# Patient Record
Sex: Male | Born: 1955
Health system: Southern US, Community
[De-identification: ages and names within clinical notes are randomized; demographics above are authoritative.]

## PROBLEM LIST (undated history)

## (undated) DIAGNOSIS — I219 Acute myocardial infarction, unspecified: Secondary | ICD-10-CM

## (undated) DIAGNOSIS — I2510 Atherosclerotic heart disease of native coronary artery without angina pectoris: Secondary | ICD-10-CM

## (undated) DIAGNOSIS — N529 Male erectile dysfunction, unspecified: Secondary | ICD-10-CM

## (undated) DIAGNOSIS — R7303 Prediabetes: Secondary | ICD-10-CM

## (undated) DIAGNOSIS — T466X5A Adverse effect of antihyperlipidemic and antiarteriosclerotic drugs, initial encounter: Secondary | ICD-10-CM

## (undated) DIAGNOSIS — I10 Essential (primary) hypertension: Secondary | ICD-10-CM

## (undated) DIAGNOSIS — K219 Gastro-esophageal reflux disease without esophagitis: Secondary | ICD-10-CM

## (undated) DIAGNOSIS — M791 Myalgia, unspecified site: Secondary | ICD-10-CM

## (undated) DIAGNOSIS — F419 Anxiety disorder, unspecified: Secondary | ICD-10-CM

## (undated) DIAGNOSIS — E039 Hypothyroidism, unspecified: Secondary | ICD-10-CM

## (undated) HISTORY — PX: CORONARY ANGIOPLASTY: SHX604

## (undated) HISTORY — DX: Acute myocardial infarction, unspecified: I21.9

## (undated) HISTORY — DX: Atherosclerotic heart disease of native coronary artery without angina pectoris: I25.10

## (undated) HISTORY — DX: Myalgia, unspecified site: M79.10

## (undated) HISTORY — DX: Male erectile dysfunction, unspecified: N52.9

## (undated) HISTORY — PX: KNEE ARTHROSCOPY: SUR90

## (undated) HISTORY — PX: CHOLECYSTECTOMY: SHX55

## (undated) HISTORY — DX: Essential (primary) hypertension: I10

## (undated) HISTORY — PX: APPENDECTOMY: SHX54

## (undated) HISTORY — DX: Adverse effect of antihyperlipidemic and antiarteriosclerotic drugs, initial encounter: T46.6X5A

---

## 1998-01-15 ENCOUNTER — Emergency Department (HOSPITAL_COMMUNITY): Admission: EM | Admit: 1998-01-15 | Discharge: 1998-01-15 | Payer: Self-pay | Admitting: Emergency Medicine

## 1998-05-02 ENCOUNTER — Encounter: Payer: Self-pay | Admitting: Family Medicine

## 1998-05-02 ENCOUNTER — Ambulatory Visit (HOSPITAL_COMMUNITY): Admission: RE | Admit: 1998-05-02 | Discharge: 1998-05-02 | Payer: Self-pay | Admitting: Family Medicine

## 1998-05-08 ENCOUNTER — Ambulatory Visit (HOSPITAL_COMMUNITY): Admission: RE | Admit: 1998-05-08 | Discharge: 1998-05-09 | Payer: Self-pay | Admitting: General Surgery

## 1998-05-08 ENCOUNTER — Encounter: Payer: Self-pay | Admitting: General Surgery

## 1999-01-03 ENCOUNTER — Encounter: Admission: RE | Admit: 1999-01-03 | Discharge: 1999-01-03 | Payer: Self-pay | Admitting: Pediatrics

## 1999-01-03 ENCOUNTER — Encounter: Payer: Self-pay | Admitting: Pediatrics

## 2002-07-27 ENCOUNTER — Ambulatory Visit (HOSPITAL_COMMUNITY): Admission: RE | Admit: 2002-07-27 | Discharge: 2002-07-27 | Payer: Self-pay | Admitting: Gastroenterology

## 2002-07-27 ENCOUNTER — Encounter: Payer: Self-pay | Admitting: Gastroenterology

## 2002-08-07 ENCOUNTER — Emergency Department (HOSPITAL_COMMUNITY): Admission: EM | Admit: 2002-08-07 | Discharge: 2002-08-07 | Payer: Self-pay | Admitting: Emergency Medicine

## 2015-10-02 ENCOUNTER — Encounter (HOSPITAL_COMMUNITY)
Admission: EM | Disposition: A | Discharge status: Home / Self Care | Payer: Self-pay | Source: Home / Self Care | Attending: Cardiology

## 2015-10-02 ENCOUNTER — Encounter (HOSPITAL_BASED_OUTPATIENT_CLINIC_OR_DEPARTMENT_OTHER): Payer: Self-pay | Admitting: *Deleted

## 2015-10-02 ENCOUNTER — Other Ambulatory Visit: Payer: Self-pay

## 2015-10-02 ENCOUNTER — Emergency Department (HOSPITAL_BASED_OUTPATIENT_CLINIC_OR_DEPARTMENT_OTHER): Payer: 59

## 2015-10-02 ENCOUNTER — Inpatient Hospital Stay (HOSPITAL_BASED_OUTPATIENT_CLINIC_OR_DEPARTMENT_OTHER)
Admission: EM | Admit: 2015-10-02 | Discharge: 2015-10-04 | DRG: 247 | Disposition: A | Discharge status: Home / Self Care | Payer: 59 | Attending: Cardiology | Admitting: Cardiology

## 2015-10-02 ENCOUNTER — Emergency Department (HOSPITAL_COMMUNITY): Admission: RE | Admit: 2015-10-02 | Payer: Self-pay | Source: Ambulatory Visit

## 2015-10-02 DIAGNOSIS — I255 Ischemic cardiomyopathy: Secondary | ICD-10-CM | POA: Diagnosis present

## 2015-10-02 DIAGNOSIS — K219 Gastro-esophageal reflux disease without esophagitis: Secondary | ICD-10-CM | POA: Diagnosis present

## 2015-10-02 DIAGNOSIS — I472 Ventricular tachycardia: Secondary | ICD-10-CM | POA: Diagnosis present

## 2015-10-02 DIAGNOSIS — R079 Chest pain, unspecified: Secondary | ICD-10-CM | POA: Diagnosis present

## 2015-10-02 DIAGNOSIS — I2109 ST elevation (STEMI) myocardial infarction involving other coronary artery of anterior wall: Secondary | ICD-10-CM | POA: Diagnosis present

## 2015-10-02 DIAGNOSIS — I2102 ST elevation (STEMI) myocardial infarction involving left anterior descending coronary artery: Secondary | ICD-10-CM

## 2015-10-02 DIAGNOSIS — I2510 Atherosclerotic heart disease of native coronary artery without angina pectoris: Secondary | ICD-10-CM | POA: Clinically undetermined

## 2015-10-02 HISTORY — DX: Gastro-esophageal reflux disease without esophagitis: K21.9

## 2015-10-02 HISTORY — PX: CARDIAC CATHETERIZATION: SHX172

## 2015-10-02 LAB — CBC WITH DIFFERENTIAL/PLATELET
Basophils Absolute: 0.1 K/uL (ref 0.0–0.1)
Basophils Relative: 0 %
Eosinophils Absolute: 0.1 K/uL (ref 0.0–0.7)
Eosinophils Relative: 0 %
HCT: 52.5 % — ABNORMAL HIGH (ref 39.0–52.0)
Hemoglobin: 18.8 g/dL — ABNORMAL HIGH (ref 13.0–17.0)
Lymphocytes Relative: 16 %
Lymphs Abs: 2.8 K/uL (ref 0.7–4.0)
MCH: 31.7 pg (ref 26.0–34.0)
MCHC: 35.8 g/dL (ref 30.0–36.0)
MCV: 88.5 fL (ref 78.0–100.0)
Monocytes Absolute: 1.4 K/uL — ABNORMAL HIGH (ref 0.1–1.0)
Monocytes Relative: 8 %
Neutro Abs: 13 K/uL — ABNORMAL HIGH (ref 1.7–7.7)
Neutrophils Relative %: 76 %
Platelets: 391 K/uL (ref 150–400)
RBC: 5.93 MIL/uL — ABNORMAL HIGH (ref 4.22–5.81)
RDW: 12.1 % (ref 11.5–15.5)
WBC: 17.3 K/uL — ABNORMAL HIGH (ref 4.0–10.5)

## 2015-10-02 LAB — BASIC METABOLIC PANEL
Anion gap: 13 (ref 5–15)
BUN: 13 mg/dL (ref 6–20)
CHLORIDE: 100 mmol/L — AB (ref 101–111)
CO2: 24 mmol/L (ref 22–32)
CREATININE: 0.83 mg/dL (ref 0.61–1.24)
Calcium: 9.3 mg/dL (ref 8.9–10.3)
GFR calc non Af Amer: >60 mL/min (ref >60)
Glucose, Bld: 196 mg/dL — ABNORMAL HIGH (ref 65–99)
Potassium: 3.4 mmol/L — ABNORMAL LOW (ref 3.5–5.1)
Sodium: 137 mmol/L (ref 135–145)

## 2015-10-02 LAB — TSH: TSH: 4.228 u[IU]/mL (ref 0.350–4.500)

## 2015-10-02 LAB — PROTIME-INR
INR: 0.96
Prothrombin Time: 12.8 s (ref 11.4–15.2)

## 2015-10-02 LAB — LIPID PANEL
Cholesterol: 122 mg/dL (ref 0–200)
HDL: 36 mg/dL — AB (ref >40)
LDL CALC: 77 mg/dL (ref 0–99)
TRIGLYCERIDES: 45 mg/dL (ref <150)
Total CHOL/HDL Ratio: 3.4 RATIO
VLDL: 9 mg/dL (ref 0–40)

## 2015-10-02 LAB — POCT ACTIVATED CLOTTING TIME: Activated Clotting Time: 274 seconds

## 2015-10-02 LAB — MRSA PCR SCREENING: MRSA BY PCR: NEGATIVE

## 2015-10-02 LAB — TROPONIN I
Troponin I: 1.66 ng/mL
Troponin I: 2.63 ng/mL (ref <0.03)

## 2015-10-02 SURGERY — LEFT HEART CATH AND CORONARY ANGIOGRAPHY

## 2015-10-02 MED ORDER — TICAGRELOR 90 MG PO TABS
ORAL_TABLET | ORAL | Status: AC
  Filled 2015-10-02: qty 1

## 2015-10-02 MED ORDER — HEPARIN SODIUM (PORCINE) 5000 UNIT/ML IJ SOLN
4000.0000 [IU] | Freq: Once | INTRAMUSCULAR | Status: DC
  Filled 2015-10-02: qty 1

## 2015-10-02 MED ORDER — HEPARIN SODIUM (PORCINE) 1000 UNIT/ML IJ SOLN
INTRAMUSCULAR | Status: DC | PRN
  Administered 2015-10-02: 10000 [IU] via INTRAVENOUS

## 2015-10-02 MED ORDER — METOPROLOL TARTRATE 5 MG/5ML IV SOLN
5.0000 mg | Freq: Once | INTRAVENOUS | Status: AC
  Administered 2015-10-02: 5 mg via INTRAVENOUS
  Filled 2015-10-02: qty 5

## 2015-10-02 MED ORDER — IRBESARTAN 75 MG PO TABS
37.5000 mg | ORAL_TABLET | Freq: Every day | ORAL | Status: DC
Start: 2015-10-02 — End: 2015-10-04
  Administered 2015-10-02 – 2015-10-04 (×3): 37.5 mg via ORAL
  Filled 2015-10-02: qty 1
  Filled 2015-10-02 (×2): qty 0.5

## 2015-10-02 MED ORDER — SODIUM CHLORIDE 0.9% FLUSH: 3.0000 mL | INTRAVENOUS | Status: DC | PRN

## 2015-10-02 MED ORDER — MORPHINE SULFATE (PF) 4 MG/ML IV SOLN
4.0000 mg | INTRAVENOUS | Status: DC | PRN
  Administered 2015-10-02: 4 mg via INTRAVENOUS
  Filled 2015-10-02: qty 1

## 2015-10-02 MED ORDER — HEPARIN BOLUS VIA INFUSION
4000.0000 [IU] | Freq: Once | INTRAVENOUS | Status: AC
  Administered 2015-10-02: 4000 [IU] via INTRAVENOUS

## 2015-10-02 MED ORDER — LIDOCAINE HCL (PF) 1 % IJ SOLN
INTRAMUSCULAR | Status: DC | PRN
  Administered 2015-10-02: 5 mL via SUBCUTANEOUS

## 2015-10-02 MED ORDER — HYDROMORPHONE HCL 1 MG/ML IJ SOLN
INTRAMUSCULAR | Status: AC
  Filled 2015-10-02: qty 1

## 2015-10-02 MED ORDER — ONDANSETRON HCL 4 MG/2ML IJ SOLN
4.0000 mg | Freq: Once | INTRAMUSCULAR | Status: AC
  Administered 2015-10-02: 4 mg via INTRAVENOUS
  Filled 2015-10-02: qty 2

## 2015-10-02 MED ORDER — SODIUM CHLORIDE 0.9 % IV SOLN
250.0000 mL | INTRAVENOUS | Status: DC | PRN
Start: 2015-10-02 — End: 2015-10-04

## 2015-10-02 MED ORDER — HEPARIN (PORCINE) IN NACL 100-0.45 UNIT/ML-% IJ SOLN
1200.0000 [IU]/h | INTRAMUSCULAR | Status: DC
  Administered 2015-10-02: 1200 [IU]/h via INTRAVENOUS

## 2015-10-02 MED ORDER — NITROGLYCERIN 1 MG/10 ML FOR IR/CATH LAB
INTRA_ARTERIAL | Status: AC
  Filled 2015-10-02: qty 10

## 2015-10-02 MED ORDER — ACETAMINOPHEN 325 MG PO TABS: 650.0000 mg | ORAL_TABLET | ORAL | Status: DC | PRN

## 2015-10-02 MED ORDER — HEPARIN (PORCINE) IN NACL 100-0.45 UNIT/ML-% IJ SOLN
15.0000 [IU]/kg/h | Freq: Once | INTRAMUSCULAR | Status: DC
  Filled 2015-10-02: qty 250

## 2015-10-02 MED ORDER — TICAGRELOR 90 MG PO TABS
ORAL_TABLET | ORAL | Status: DC | PRN
  Administered 2015-10-02: 180 mg via ORAL

## 2015-10-02 MED ORDER — IOPAMIDOL (ISOVUE-370) INJECTION 76%
INTRAVENOUS | Status: DC | PRN
  Administered 2015-10-02: 110 mL

## 2015-10-02 MED ORDER — HEPARIN SODIUM (PORCINE) 1000 UNIT/ML IJ SOLN
INTRAMUSCULAR | Status: AC
  Filled 2015-10-02: qty 1

## 2015-10-02 MED ORDER — TICAGRELOR 90 MG PO TABS
90.0000 mg | ORAL_TABLET | Freq: Two times a day (BID) | ORAL | Status: DC
  Administered 2015-10-03: 90 mg via ORAL
  Filled 2015-10-02: qty 1

## 2015-10-02 MED ORDER — HYDROMORPHONE HCL 1 MG/ML IJ SOLN
INTRAMUSCULAR | Status: DC | PRN
  Administered 2015-10-02: 0.5 mg via INTRAVENOUS

## 2015-10-02 MED ORDER — VERAPAMIL HCL 2.5 MG/ML IV SOLN
INTRAVENOUS | Status: AC
  Filled 2015-10-02: qty 2

## 2015-10-02 MED ORDER — VERAPAMIL HCL 2.5 MG/ML IV SOLN
INTRA_ARTERIAL | Status: DC | PRN
  Administered 2015-10-02: 17:00:00 via INTRA_ARTERIAL

## 2015-10-02 MED ORDER — POTASSIUM CHLORIDE 20 MEQ PO PACK
20.0000 meq | PACK | Freq: Once | ORAL | Status: AC
  Administered 2015-10-02: 20 meq via ORAL
  Filled 2015-10-02: qty 1

## 2015-10-02 MED ORDER — MIDAZOLAM HCL 2 MG/2ML IJ SOLN
INTRAMUSCULAR | Status: DC | PRN
  Administered 2015-10-02: 2 mg via INTRAVENOUS

## 2015-10-02 MED ORDER — ASPIRIN 81 MG PO CHEW
324.0000 mg | CHEWABLE_TABLET | Freq: Once | ORAL | Status: AC
  Administered 2015-10-02: 324 mg via ORAL
  Filled 2015-10-02: qty 4

## 2015-10-02 MED ORDER — CARVEDILOL 3.125 MG PO TABS
3.1250 mg | ORAL_TABLET | Freq: Two times a day (BID) | ORAL | Status: DC
  Administered 2015-10-03: 3.125 mg via ORAL
  Filled 2015-10-02: qty 1

## 2015-10-02 MED ORDER — SODIUM CHLORIDE 0.9 % IV SOLN
INTRAVENOUS | Status: AC
  Administered 2015-10-02: 20:00:00 via INTRAVENOUS

## 2015-10-02 MED ORDER — ONDANSETRON HCL 4 MG/2ML IJ SOLN: 4.0000 mg | Freq: Four times a day (QID) | INTRAMUSCULAR | Status: DC | PRN

## 2015-10-02 MED ORDER — TICAGRELOR 90 MG PO TABS: 180.0000 mg | ORAL_TABLET | Freq: Once | ORAL | Status: DC

## 2015-10-02 MED ORDER — HEPARIN (PORCINE) IN NACL 2-0.9 UNIT/ML-% IJ SOLN
INTRAMUSCULAR | Status: AC
  Filled 2015-10-02: qty 1000

## 2015-10-02 MED ORDER — ASPIRIN 81 MG PO CHEW
81.0000 mg | CHEWABLE_TABLET | Freq: Every day | ORAL | Status: DC
  Administered 2015-10-03 – 2015-10-04 (×2): 81 mg via ORAL
  Filled 2015-10-02 (×2): qty 1

## 2015-10-02 MED ORDER — LIDOCAINE HCL (PF) 1 % IJ SOLN
INTRAMUSCULAR | Status: AC
  Filled 2015-10-02: qty 30

## 2015-10-02 MED ORDER — MIDAZOLAM HCL 2 MG/2ML IJ SOLN
INTRAMUSCULAR | Status: AC
  Filled 2015-10-02: qty 2

## 2015-10-02 MED ORDER — NITROGLYCERIN IN D5W 200-5 MCG/ML-% IV SOLN
5.0000 ug/min | Freq: Once | INTRAVENOUS | Status: AC
  Administered 2015-10-02: 10 ug/min via INTRAVENOUS
  Filled 2015-10-02: qty 250

## 2015-10-02 MED ORDER — HEPARIN (PORCINE) IN NACL 2-0.9 UNIT/ML-% IJ SOLN
INTRAMUSCULAR | Status: DC | PRN
  Administered 2015-10-02: 18:00:00

## 2015-10-02 MED ORDER — NITROGLYCERIN 1 MG/10 ML FOR IR/CATH LAB
INTRA_ARTERIAL | Status: DC | PRN
  Administered 2015-10-02: 200 ug via INTRACORONARY

## 2015-10-02 MED ORDER — ATORVASTATIN CALCIUM 80 MG PO TABS
80.0000 mg | ORAL_TABLET | Freq: Every day | ORAL | Status: DC
  Administered 2015-10-03: 80 mg via ORAL
  Filled 2015-10-02: qty 1

## 2015-10-02 MED ORDER — SODIUM CHLORIDE 0.9% FLUSH
3.0000 mL | Freq: Two times a day (BID) | INTRAVENOUS | Status: DC
  Administered 2015-10-02 – 2015-10-03 (×3): 3 mL via INTRAVENOUS

## 2015-10-02 SURGICAL SUPPLY — 17 items
BALLN EMERGE MR 2.5X12 (BALLOONS) ×2
BALLN ~~LOC~~ TREK RX 3.5X15 (BALLOONS) ×2
BALLOON EMERGE MR 2.5X12 (BALLOONS) ×1 IMPLANT
BALLOON ~~LOC~~ TREK RX 3.5X15 (BALLOONS) ×1 IMPLANT
CATH HEARTRAIL 6F IL4.0 (CATHETERS) ×2 IMPLANT
CATH OPTITORQUE TIG 4.5 5F (CATHETERS) ×2 IMPLANT
DEVICE RAD COMP TR BAND LRG (VASCULAR PRODUCTS) ×2 IMPLANT
GLIDESHEATH SLEND A-KIT 6F 20G (SHEATH) ×2 IMPLANT
GUIDE CATH RUNWAY 6FR AL2 (CATHETERS) IMPLANT
KIT ENCORE 26 ADVANTAGE (KITS) ×2 IMPLANT
KIT HEART LEFT (KITS) ×2 IMPLANT
PACK CARDIAC CATHETERIZATION (CUSTOM PROCEDURE TRAY) ×2 IMPLANT
STENT XIENCE ALPINE RX 3.5X23 (Permanent Stent) ×2 IMPLANT
TRANSDUCER W/STOPCOCK (MISCELLANEOUS) ×2 IMPLANT
TUBING CIL FLEX 10 FLL-RA (TUBING) ×2 IMPLANT
WIRE COUGAR XT STRL 190CM (WIRE) ×2 IMPLANT
WIRE SAFE-T 1.5MM-J .035X260CM (WIRE) ×2 IMPLANT

## 2015-10-02 NOTE — ED Provider Notes (Signed)
[facility]-EMERGENCY DEPT Provider Note   CSN: [id number] Arrival date & time: 10/02/15  1540  First Provider Contact:  None    By signing my name below, I, Majel Homer, attest that this documentation has been prepared under the direction and in the presence of Rolland Porter, MD . Electronically Signed: Majel Homer, Scribe. 10/02/2015. 4:04 PM.  History   Chief Complaint Chief Complaint  Patient presents with  . Chest Pain    HPI Comments: Daniel Walter is a 60 y.o. male who presents to the Emergency Department complaining of gradually worsening, "burning," mid-sternum chest pain that began 1 week ago and worsened last night. Pt states his pain is radiating to his lower back. He notes associated belching that occurs all the time and nausea when his pain becomes severe. Pt reports he was seen at Urgent Care on 7/21 in which he received an EKG and CXR; he notes he did not receive any blood work. He states he was not in any pain upon leaving Urgent Care; he notes his pain did not recur until last night. Pt denies nausea, vomiting, neck pain and swelling in his extremities. He also denies hx of DM, HLD, HTN, smoking and bleeding excessively.   The history is provided by the patient. No language interpreter was used.   Past Medical History:  Diagnosis Date  . GERD (gastroesophageal reflux disease)     Patient Active Problem List   Diagnosis Date Noted  . Acute MI, anterolateral wall, initial episode of care (HCC) 10/02/2015  . CAD (coronary artery disease), native coronary artery 10/02/2015    Past Surgical History:  Procedure Laterality Date  . APPENDECTOMY    . CARDIAC CATHETERIZATION N/A 10/02/2015   Procedure: Left Heart Cath and Coronary Angiography;  Surgeon: Yates Decamp, MD;  Location: [facility] INVASIVE CV LAB;  Service: Cardiovascular;  Laterality: N/A;  . CARDIAC CATHETERIZATION N/A 10/02/2015   Procedure: Coronary Stent Intervention;  Surgeon: Yates Decamp, MD;  Location: [facility] INVASIVE CV LAB;   Service: Cardiovascular;  Laterality: N/A;  Prox LAD  . CHOLECYSTECTOMY    . KNEE ARTHROSCOPY      Home Medications    Prior to Admission medications   Medication Sig Start Date End Date Taking? Authorizing Provider  alum & mag hydroxide-simeth (MAALOX PLUS) 400-400-40 MG/5ML suspension Take 5-15 mLs by mouth every 6 (six) hours as needed for indigestion.   Yes Historical Provider, MD  Cholecalciferol (VITAMIN D3) 5000 units CAPS Take 1 capsule by mouth daily.   Yes Historical Provider, MD  Cyanocobalamin (VITAMIN B-12) 5000 MCG TBDP Take 1 tablet by mouth daily.   Yes Historical Provider, MD  omeprazole (PRILOSEC OTC) 20 MG tablet Take 20 mg by mouth daily.   Yes Historical Provider, MD  Probiotic Product (PROBIOTIC-10) CHEW Chew 1 tablet by mouth daily. MILK-FREE   Yes Historical Provider, MD  ranitidine (ZANTAC) 75 MG tablet Take 75 mg by mouth 2 (two) times daily as needed for heartburn.    Yes Historical Provider, MD  Riboflavin 400 MG CAPS Take 1 capsule by mouth daily.   Yes Historical Provider, MD  aspirin 81 MG chewable tablet Chew 1 tablet (81 mg total) by mouth daily. 10/04/15   Marcy Salvo, NP  atorvastatin (LIPITOR) 80 MG tablet Take 1 tablet (80 mg total) by mouth daily at 6 PM. 10/04/15   Marcy Salvo, NP  carvedilol (COREG) 6.25 MG tablet Take 1 tablet (6.25 mg total) by mouth 2 (two) times daily with a meal. 10/04/15  Marcy Salvo, NP  ticagrelor (BRILINTA) 90 MG TABS tablet Take 1 tablet (90 mg total) by mouth 2 (two) times daily. 10/04/15   Marcy Salvo, NP    Family History No family history on file.  Social History Social History  Substance Use Topics  . Smoking status: Never Smoker  . Smokeless tobacco: Never Used  . Alcohol use Not on file     Allergies   Milk-related compounds  Review of Systems Review of Systems  Constitutional: Negative for appetite change, chills, diaphoresis, fatigue and fever.  HENT: Negative for mouth sores, sore  throat and trouble swallowing.   Eyes: Negative for visual disturbance.  Respiratory: Negative for cough, chest tightness, shortness of breath and wheezing.   Cardiovascular: Positive for chest pain. Negative for leg swelling.  Gastrointestinal: Negative for abdominal distention, abdominal pain, diarrhea, nausea and vomiting.  Endocrine: Negative for polydipsia, polyphagia and polyuria.  Genitourinary: Negative for dysuria, frequency and hematuria.  Musculoskeletal: Negative for gait problem.  Skin: Negative for color change, pallor and rash.  Neurological: Negative for dizziness, syncope, light-headedness and headaches.  Hematological: Does not bruise/bleed easily.  Psychiatric/Behavioral: Negative for behavioral problems and confusion.   Physical Exam Updated Vital Signs BP 103/76 (BP Location: Left Arm)   Pulse (!) 105   Temp 98.6 F (37 C) (Oral)   Resp 18   Ht 5\' 10"  (1.778 m)   Wt 190 lb 8 oz (86.4 kg)   SpO2 98%   BMI 27.33 kg/m   Physical Exam  Constitutional: He is oriented to person, place, and time. He appears well-developed and well-nourished. No distress.  HENT:  Head: Normocephalic.  Eyes: Conjunctivae are normal. Pupils are equal, round, and reactive to light. No scleral icterus.  Neck: Normal range of motion. Neck supple. No thyromegaly present.  Cardiovascular: Exam reveals no gallop and no friction rub.   No murmur heard. Pulmonary/Chest: No respiratory distress. He has no wheezes. He has no rales.  Abdominal: Soft. Bowel sounds are normal. He exhibits no distension. There is no tenderness. There is no rebound.  Musculoskeletal: Normal range of motion.  Neurological: He is alert and oriented to person, place, and time.  Skin: Skin is warm and dry. No rash noted.  Psychiatric: He has a normal mood and affect. His behavior is normal.   ED Treatments / Results  Labs (all labs ordered are listed, but only abnormal results are displayed) Labs Reviewed  CBC  WITH DIFFERENTIAL/PLATELET - Abnormal; Notable for the following:       Result Value   WBC 17.3 (*)    RBC 5.93 (*)    Hemoglobin 18.8 (*)    HCT 52.5 (*)    Neutro Abs 13.0 (*)    Monocytes Absolute 1.4 (*)    All other components within normal limits  BASIC METABOLIC PANEL - Abnormal; Notable for the following:    Potassium 3.4 (*)    Chloride 100 (*)    Glucose, Bld 196 (*)    All other components within normal limits  TROPONIN I - Abnormal; Notable for the following:    Troponin I 1.66 (*)    All other components within normal limits  LIPID PANEL - Abnormal; Notable for the following:    HDL 36 (*)    All other components within normal limits  TROPONIN I - Abnormal; Notable for the following:    Troponin I 2.63 (*)    All other components within normal limits  TROPONIN I - Abnormal; Notable  for the following:    Troponin I 7.73 (*)    All other components within normal limits  TROPONIN I - Abnormal; Notable for the following:    Troponin I 8.09 (*)    All other components within normal limits  BASIC METABOLIC PANEL - Abnormal; Notable for the following:    Sodium 134 (*)    Glucose, Bld 211 (*)    All other components within normal limits  CBC - Abnormal; Notable for the following:    WBC 13.2 (*)    Hemoglobin 17.3 (*)    All other components within normal limits  MRSA PCR SCREENING  PROTIME-INR  TSH  POCT ACTIVATED CLOTTING TIME   EKG  EKG Interpretation  Date/Time:  Monday October 02 2015 16:02:42 EDT Ventricular Rate:  126 PR Interval:  146 QRS Duration: 84 QT Interval:  294 QTC Calculation: 425 R Axis:   4 Text Interpretation:  Sinus tachycardia ST-t wave abnormality ST depression, consider subendocardial injury Abnormal ekg Confirmed by Gerhard Munch  MD [phone]) on 10/03/2015 11:43:18 PM       Radiology No results found. Procedures Procedures  DIAGNOSTIC STUDIES:  Oxygen Saturation is 100% on RA, normal by my interpretation.    COORDINATION OF  CARE:  4:01 PM Discussed treatment plan with pt at bedside and pt agreed to plan.   Medications Ordered in ED Medications  0.9 %  sodium chloride infusion ( Intravenous Stopped 10/03/15 0000)  perflutren lipid microspheres (DEFINITY) IV suspension (2 mLs Intravenous Given 10/03/15 1006)  valsartan (DIOVAN) tablet 40 mg (not administered)  aspirin chewable tablet 324 mg (324 mg Oral Given 10/02/15 1608)  ondansetron (ZOFRAN) injection 4 mg (4 mg Intravenous Given 10/02/15 1608)  nitroGLYCERIN 50 mg in dextrose 5 % 250 mL (0.2 mg/mL) infusion (0 mcg/min Intravenous Stopped 10/02/15 1800)  metoprolol (LOPRESSOR) injection 5 mg (5 mg Intravenous Given 10/02/15 1611)  heparin bolus via infusion 4,000 Units (4,000 Units Intravenous Bolus from Bag 10/02/15 1616)  potassium chloride (KLOR-CON) packet 20 mEq (20 mEq Oral Given 10/02/15 1936)  PERFLUTREN LIPID MICROSPHERE injection SUSP (  Duplicate 10/03/15 1000)   Initial Impression / Assessment and Plan / ED Course  I have reviewed the triage vital signs and the nursing notes.  Pertinent labs & imaging results that were available during my care of the patient were reviewed by me and considered in my medical decision making (see chart for details).  Clinical Course    ST elevations noted on EKG. They emergently placed a call to Dr. Clarice Pole our cardiologist on-call, as well as "code STEMI". Patient given heparin bolus and started on infusion. Intravenous illicit infusion as well as aspirin by mouth.  Rinse made for emergent transfer to [facility] for PTCA.  Final Clinical Impressions(s) / ED Diagnoses   Final diagnoses:  ST elevation myocardial infarction involving left anterior descending (LAD) coronary artery (HCC)     CRITICAL CARE Performed by: Rolland Porter JOSEPH   Total critical care time: 25 minutes  Critical care time was exclusive of separately billable procedures and treating other patients.  Critical care was necessary to treat or prevent  imminent or life-threatening deterioration.  Critical care was time spent personally by me on the following activities: development of treatment plan with patient and/or surrogate as well as nursing, discussions with consultants, evaluation of patient's response to treatment, examination of patient, obtaining history from patient or surrogate, ordering and performing treatments and interventions, ordering and review of laboratory studies, ordering and review  of radiographic studies, pulse oximetry and re-evaluation of patient's condition.  I personally performed the services described in this documentation, which was scribed in my presence. The recorded information has been reviewed and is accurate.   New Prescriptions Discharge Medication List as of 10/04/2015  3:35 PM    START taking these medications   Details  aspirin 81 MG chewable tablet Chew 1 tablet (81 mg total) by mouth daily., Starting Wed 10/04/2015, Normal    atorvastatin (LIPITOR) 80 MG tablet Take 1 tablet (80 mg total) by mouth daily at 6 PM., Starting Wed 10/04/2015, Normal    carvedilol (COREG) 6.25 MG tablet Take 1 tablet (6.25 mg total) by mouth 2 (two) times daily with a meal., Starting Wed 10/04/2015, Normal    ticagrelor (BRILINTA) 90 MG TABS tablet Take 1 tablet (90 mg total) by mouth 2 (two) times daily., Starting Wed 10/04/2015, Normal       I personally performed the services described in this documentation, which was scribed in my presence. The recorded information has been reviewed and is accurate.     Rolland Porter, MD 10/11/15 [phone]

## 2015-10-02 NOTE — H&P (Signed)
Daniel Walter is an 60 y.o. male.   Chief Complaint: Chest pain HPI: Daniel Walter  is a 60 y.o. male  With no significant prior cardiovascular history, quit in complaining of chest pain that started about a week ago with exertional activity described as tightness to burning sensation in the chest with radiation to his back. He had atypical presentation with some reproducibility, also stating that belching would relieve his chest pain. He was evaluated at an urgent care in the outpatient basis where his EKG and chest x-ray was within normal limits, was started on PPI discharge home. Since then he has been having on and off chest discomfort. Last night he woke up with severe chest pain with radiation to the back, in fact slept with a ice pack on his chest.  Due to persistent chest discomfort, he again made an appointment to be seen in the urgent care Center to establish care as he had not seen any physicians in the past. He was evaluated at around 2:00, again EKG was normal, CBC done revealed elevated hemoglobin and also white count, we discussed fact that he may need further evaluation and was referred to go to the emergency room. Upon the emergency room, a repeat EKG revealed ST segment elevation in the anterior leads, STEMI was activated, he was urgently transferred to [facility] for Further Evaluation and Management. Patient Continued to Have Burning Sensation in the Chest While in the Table but States That Pain Is Coming on and off. No Other Associated Symptoms. No nausea, vomiting.  Past Medical History:  Diagnosis Date  . GERD (gastroesophageal reflux disease)     Past Surgical History:  Procedure Laterality Date  . APPENDECTOMY    . CHOLECYSTECTOMY    . KNEE ARTHROSCOPY      Family history: There is no family history of premature coronary artery disease or diabetes in the family. Father has history of cerebral aneurysm. Details not available.  Social History:  reports that he has  never smoked. He has never used smokeless tobacco. He reports that he does not use drugs. His alcohol history is not on file.  Allergies:  Allergies  Allergen Reactions  . Milk-Related Compounds Diarrhea and Other (See Comments)    NO DAIRY; LETHARGY (ALSO)    Review of Systems - Chest pain, GERD. Other system negative.  Blood pressure (!) 127/92, pulse (!) 118, temperature 98.5 F (36.9 C), temperature source Oral, resp. rate 10, height 5\' 10"  (1.778 m), weight 88.9 kg (196 lb), SpO2 95 %. General appearance: alert, cooperative, appears stated age and no distress Eyes: negative findings: lids and lashes normal Neck: no adenopathy, no carotid bruit, no JVD, supple, symmetrical, trachea midline and thyroid not enlarged, symmetric, no tenderness/mass/nodules Neck: JVP - normal, carotids 2+= without bruits Resp: clear to auscultation bilaterally Chest wall: no tenderness Cardio: regular rate and rhythm, S1, S2 normal, no murmur, click, rub or gallop and Distant heart sounds GI: soft, non-tender; bowel sounds normal; no masses,  no organomegaly Extremities: extremities normal, atraumatic, no cyanosis or edema Pulses: 2+ and symmetric Skin: Skin color, texture, turgor normal. No rashes or lesions Neurologic: Grossly normal  Results for orders placed or performed during the hospital encounter of 10/02/15 (from the past 48 hour(s))  CBC with Differential/Platelet     Status: Abnormal   Collection Time: 10/02/15  4:00 PM  Result Value Ref Range   WBC 17.3 (H) 4.0 - 10.5 K/uL   RBC 5.93 (H) 4.22 - 5.81  MIL/uL   Hemoglobin 18.8 (H) 13.0 - 17.0 g/dL   HCT 52.5 (H) 39.0 - 52.0 %   MCV 88.5 78.0 - 100.0 fL   MCH 31.7 26.0 - 34.0 pg   MCHC 35.8 30.0 - 36.0 g/dL   RDW 12.1 11.5 - 15.5 %   Platelets 391 150 - 400 K/uL   Neutrophils Relative % 76 %   Neutro Abs 13.0 (H) 1.7 - 7.7 K/uL   Lymphocytes Relative 16 %   Lymphs Abs 2.8 0.7 - 4.0 K/uL   Monocytes Relative 8 %   Monocytes Absolute  1.4 (H) 0.1 - 1.0 K/uL   Eosinophils Relative 0 %   Eosinophils Absolute 0.1 0.0 - 0.7 K/uL   Basophils Relative 0 %   Basophils Absolute 0.1 0.0 - 0.1 K/uL  Basic metabolic panel     Status: Abnormal   Collection Time: 10/02/15  4:00 PM  Result Value Ref Range   Sodium 137 135 - 145 mmol/L   Potassium 3.4 (L) 3.5 - 5.1 mmol/L   Chloride 100 (L) 101 - 111 mmol/L   CO2 24 22 - 32 mmol/L   Glucose, Bld 196 (H) 65 - 99 mg/dL   BUN 13 6 - 20 mg/dL   Creatinine, Ser 0.83 0.61 - 1.24 mg/dL   Calcium 9.3 8.9 - 10.3 mg/dL   GFR calc non Af Amer >60 >60 mL/min   GFR calc Af Amer >60 >60 mL/min    Comment: (NOTE) The eGFR has been calculated using the CKD EPI equation. This calculation has not been validated in all clinical situations. eGFR's persistently <60 mL/min signify possible Chronic Kidney Disease.    Anion gap 13 5 - 15  Protime-INR     Status: None   Collection Time: 10/02/15  4:00 PM  Result Value Ref Range   Prothrombin Time 12.8 11.4 - 15.2 seconds   INR 0.96   Troponin I     Status: Abnormal   Collection Time: 10/02/15  4:00 PM  Result Value Ref Range   Troponin I 1.66 (HH) <0.03 ng/mL    Comment: CRITICAL RESULT CALLED TO, READ BACK BY AND VERIFIED WITH: AMY BURNS RN _0  10/02/2015 OLSONM    Dg Chest Port 1 View  Result Date: 10/02/2015 CLINICAL DATA:  Chest pain, history GERD EXAM: PORTABLE CHEST 1 VIEW COMPARISON:  Portable exam 1601 hours compared to 09/22/2015 FINDINGS: Normal heart size, mediastinal contours, and pulmonary vascularity. Lungs clear. No pleural effusion or pneumothorax. Bones unremarkable. IMPRESSION: No acute abnormalities. Electronically Signed   By: Lavonia Dana M.D.   On: 10/02/2015 16:17    Labs:   Lab Results  Component Value Date   WBC 17.3 (H) 10/02/2015   HGB 18.8 (H) 10/02/2015   HCT 52.5 (H) 10/02/2015   MCV 88.5 10/02/2015   PLT 391 10/02/2015    Recent Labs Lab 10/02/15 1600  NA 137  K 3.4*  CL 100*  CO2 24  BUN 13   CREATININE 0.83  CALCIUM 9.3  GLUCOSE 196*    Lab Results  Component Value Date   TROPONINI 1.66 ([place]) 10/02/2015    EKG 10/02/2015: 1603 hrs. Sinus tachycardia at a rate of 126 bpm, ST elevation in V1 to V4 with reciprocal ST depression in inferior and lateral lead, acute injury pattern.  EKG 1546 hrs.: Normal sinus rhythm, early repolarization versus ST elevation in anterior leads, nonspecific ST segment changes.  Medications Prior to Admission  Medication Sig Dispense Refill  . alum &  mag hydroxide-simeth (MAALOX PLUS) 400-400-40 MG/5ML suspension Take 5-15 mLs by mouth every 6 (six) hours as needed for indigestion.    . Cholecalciferol (VITAMIN D3) 5000 units CAPS Take 1 capsule by mouth daily.    . Cyanocobalamin (VITAMIN B-12) 5000 MCG TBDP Take 1 tablet by mouth daily.    Marland Kitchen omeprazole (PRILOSEC OTC) 20 MG tablet Take 20 mg by mouth daily.    . Probiotic Product (PROBIOTIC-10) CHEW Chew 1 tablet by mouth daily. MILK-FREE    . ranitidine (ZANTAC) 75 MG tablet Take 75 mg by mouth 2 (two) times daily.    . Riboflavin 400 MG CAPS Take 1 capsule by mouth daily.      Current Facility-Administered Medications:  .  morphine 4 MG/ML injection 4 mg, 4 mg, Intravenous, Q1H PRN, Tanna Furry, MD, 4 mg at 10/02/15 1606 .  potassium chloride (KLOR-CON) packet 20 mEq, 20 mEq, Oral, Once, Adrian Prows, MD  Assessment/Plan 1. Acute anterolateral myocardial infarction  Recommendation: Patient is being taken emergently to the cardiac catheterization lab. He has equal blood pressure in both upper extremities, good lower Deep pulses, I do not suspect dissection.  Adrian Prows, MD 10/02/2015, 6:39 PM [facility] [place] Pager: [phone] Office: [phone] If no answer: Cell:  [phone]

## 2015-10-02 NOTE — ED Notes (Signed)
MD at bedside. 

## 2015-10-02 NOTE — ED Notes (Signed)
[facility]-- on the way for transfer to [facility]

## 2015-10-02 NOTE — Progress Notes (Signed)
ANTICOAGULATION CONSULT NOTE - Initial Consult  Pharmacy Consult for Heparin Indication: chest pain/ACS  No Known Allergies  Patient Measurements: Height: 5\' 10"  (177.8 cm) Weight: 196 lb (88.9 kg) IBW/kg (Calculated) : 73 Heparin Dosing Weight: 89 kg  Vital Signs: Temp: 98.6 F (37 C) (07/31 1554) Temp Source: Oral (07/31 1554) BP: 167/127 (07/31 1554) Pulse Rate: 133 (07/31 1554)  Labs: No results for input(s): HGB, HCT, PLT, APTT, LABPROT, INR, HEPARINUNFRC, HEPRLOWMOCWT, CREATININE, CKTOTAL, CKMB, TROPONINI in the last 72 hours.  CrCl cannot be calculated (No order found.).   Medical History: Past Medical History:  Diagnosis Date  . GERD (gastroesophageal reflux disease)     Medications:   (Not in a hospital admission) Scheduled:  . aspirin  324 mg Oral Once  . heparin  15 Units/kg/hr Intravenous Once  . heparin  4,000 Units Intravenous Once  . metoprolol  5 mg Intravenous Once  . nitroGLYCERIN  5-200 mcg/min Intravenous Once  . ondansetron (ZOFRAN) IV  4 mg Intravenous Once  . ticagrelor  180 mg Oral Once   Infusions:    Assessment: 60yo male presents to [facility] with CP. Pharmacy is consulted to dose heparin for ACS/chest pain.  Goal of Therapy:  Heparin level 0.3-0.7 units/ml Monitor platelets by anticoagulation protocol: Yes   Plan:  Give 4000 units bolus x 1 Start heparin infusion at 1200 units/hr Check anti-Xa level in 6 hours and daily while on heparin Continue to monitor H&H and platelets  Arlean Hopping. Newman Pies, PharmD, BCPS Clinical Pharmacist Pager [phone] 10/02/2015,4:02 PM

## 2015-10-02 NOTE — ED Notes (Signed)
Called for portable chest xray.

## 2015-10-02 NOTE — ED Triage Notes (Signed)
C/o c/p that was stabbing pain that started last pm. Pain is thru to back. No n/v or sob. Now c/o mid sternum chest burning and pain is thru to back.

## 2015-10-03 ENCOUNTER — Inpatient Hospital Stay (HOSPITAL_COMMUNITY): Payer: 59

## 2015-10-03 ENCOUNTER — Encounter (HOSPITAL_COMMUNITY): Payer: Self-pay | Admitting: Certified Registered Nurse Anesthetist

## 2015-10-03 LAB — BASIC METABOLIC PANEL
Anion gap: 8 (ref 5–15)
BUN: 9 mg/dL (ref 6–20)
CHLORIDE: 103 mmol/L (ref 101–111)
CO2: 23 mmol/L (ref 22–32)
CREATININE: 0.75 mg/dL (ref 0.61–1.24)
Calcium: 8.9 mg/dL (ref 8.9–10.3)
Glucose, Bld: 211 mg/dL — ABNORMAL HIGH (ref 65–99)
POTASSIUM: 4.2 mmol/L (ref 3.5–5.1)
SODIUM: 134 mmol/L — AB (ref 135–145)

## 2015-10-03 LAB — CBC
HEMATOCRIT: 48.4 % (ref 39.0–52.0)
Hemoglobin: 17.3 g/dL — ABNORMAL HIGH (ref 13.0–17.0)
MCH: 32 pg (ref 26.0–34.0)
MCHC: 35.7 g/dL (ref 30.0–36.0)
MCV: 89.6 fL (ref 78.0–100.0)
PLATELETS: 292 10*3/uL (ref 150–400)
RBC: 5.4 MIL/uL (ref 4.22–5.81)
RDW: 11.8 % (ref 11.5–15.5)
WBC: 13.2 10*3/uL — AB (ref 4.0–10.5)

## 2015-10-03 LAB — ECHOCARDIOGRAM COMPLETE
Height: 70 in
Weight: 3136 oz

## 2015-10-03 LAB — TROPONIN I
TROPONIN I: 8.09 ng/mL — AB (ref <0.03)
Troponin I: 7.73 ng/mL (ref <0.03)

## 2015-10-03 MED ORDER — PERFLUTREN LIPID MICROSPHERE
INTRAVENOUS | Status: AC
  Filled 2015-10-03: qty 10

## 2015-10-03 MED ORDER — PERFLUTREN LIPID MICROSPHERE
1.0000 mL | INTRAVENOUS | Status: AC | PRN
  Administered 2015-10-03: 2 mL via INTRAVENOUS
  Filled 2015-10-03: qty 10

## 2015-10-03 MED ORDER — TICAGRELOR 90 MG PO TABS
90.0000 mg | ORAL_TABLET | Freq: Two times a day (BID) | ORAL | Status: DC
  Administered 2015-10-03 – 2015-10-04 (×2): 90 mg via ORAL
  Filled 2015-10-03 (×2): qty 1

## 2015-10-03 MED ORDER — CARVEDILOL 6.25 MG PO TABS
6.2500 mg | ORAL_TABLET | Freq: Two times a day (BID) | ORAL | Status: DC
  Administered 2015-10-03 – 2015-10-04 (×2): 6.25 mg via ORAL
  Filled 2015-10-03 (×2): qty 1

## 2015-10-03 NOTE — Progress Notes (Signed)
Echocardiogram 2D Echocardiogram has been performed with definity.  Daniel Walter 10/03/2015, 10:13 AM

## 2015-10-03 NOTE — Progress Notes (Signed)
Transferred pt to room 3w15 at this time.  Pt has no s/s of any acute distress or c/o pain.

## 2015-10-03 NOTE — Progress Notes (Signed)
Subjective:  No recurrence of chest pain, no new symptoms or concerns today. Wife at bedside.  Objective:  Vital Signs in the last 24 hours: Temp:  [98 F (36.7 C)-99.1 F (37.3 C)] 99.1 F (37.3 C) (08/01 0400) Pulse Rate:  [100-147] 110 (07/31 2000) Resp:  [9-24] 18 (08/01 0800) BP: (107-167)/(80-127) 112/88 (08/01 0800) SpO2:  [94 %-100 %] 97 % (08/01 0800) Weight:  [88.9 kg (196 lb)] 88.9 kg (196 lb) (07/31 1554)  Intake/Output from previous day: 07/31 0701 - 08/01 0700 In: 386.2 [P.O.:60; I.V.:326.2] Out: 1350 [Urine:1350]  Physical Exam: General appearance: alert, cooperative, appears stated age and no distress Eyes: negative findings: lids and lashes normal Neck: no adenopathy, no carotid bruit, no JVD, supple, symmetrical, trachea midline and thyroid not enlarged, symmetric, no tenderness/mass/nodules Resp: clear to auscultation bilaterally Chest wall: no tenderness Cardio: regular rate and rhythm, S1, S2 normal, no murmur, click, rub or gallop and Distant heart sounds GI: soft, non-tender; bowel sounds normal; no masses,  no organomegaly Extremities: extremities normal, atraumatic, no cyanosis or edema Pulses: 2+ and symmetric; right radial access site asymptomatic Skin: Skin color, texture, turgor normal. No rashes or lesions Neurologic: Grossly normal  Lab Results: BMP  Recent Labs  10/02/15 1600 10/03/15 0659  NA 137 134*  K 3.4* 4.2  CL 100* 103  CO2 24 23  GLUCOSE 196* 211*  BUN 13 9  CREATININE 0.83 0.75  CALCIUM 9.3 8.9  GFRNONAA >60 >60  GFRAA >60 >60    CBC  Recent Labs Lab 10/02/15 1600 10/03/15 0659  WBC 17.3* 13.2*  RBC 5.93* 5.40  HGB 18.8* 17.3*  HCT 52.5* 48.4  PLT 391 292  MCV 88.5 89.6  MCH 31.7 32.0  MCHC 35.8 35.7  RDW 12.1 11.8  LYMPHSABS 2.8  --   MONOABS 1.4*  --   EOSABS 0.1  --   BASOSABS 0.1  --    Recent Labs  10/02/15 1834 10/03/15 0024 10/03/15 0659  TROPONINI 2.63* 7.73* 8.09*   Recent Labs   10/02/15 1834  TSH 4.228    CHOLESTEROL Lipid Panel     Component Value Date/Time   CHOL 122 10/02/2015 1834   TRIG 45 10/02/2015 1834   HDL 36 (L) 10/02/2015 1834   CHOLHDL 3.4 10/02/2015 1834   VLDL 9 10/02/2015 1834   LDLCALC 77 10/02/2015 1834   Imaging: Dg Chest Port 1 View  Result Date: 10/02/2015 CLINICAL DATA:  Chest pain, history GERD EXAM: PORTABLE CHEST 1 VIEW COMPARISON:  Portable exam 1601 hours compared to 09/22/2015 FINDINGS: Normal heart size, mediastinal contours, and pulmonary vascularity. Lungs clear. No pleural effusion or pneumothorax. Bones unremarkable. IMPRESSION: No acute abnormalities. Electronically Signed   By: Ulyses Southward M.D.   On: 10/02/2015 16:17    Cardiac Studies: EKG 10/02/2015 at 1603: Sinus tachycardia at a rate of 126 bpm, ST elevation in V1 to V4 with reciprocal ST depression in inferior and lateral lead, acute injury pattern.  EKG 10/02/2015 at 1546: Normal sinus rhythm, early repolarization versus ST elevation in anterior leads, nonspecific ST segment changes.  EKG 10/03/2015: NSR. T Inversion anterolateral leads.   Echo 10/03/2015: LVEF  30-35% with entire anterior, septum and apical akinesis. No significant valvular abnormalites.  Coronary Angiogram 10/02/2015: Ost LAD to Prox LAD lesion, 99 %stenosed. XIENCE ALPINE RX [id number] drug eluting stent. Post intervention, there is a 0% residual stenosis. TIMI flow improved from TIMI 1 to TIMI 3 at end of the procedure. Otherwise normal coronary arteries.  There is severe left ventricular systolic dysfunction. The left ventricular ejection fraction is less than 25% by visual estimate.  Echocardiogram 10/03/2015: pending  Assessment/Plan:  1. Anterolateral MI 2. S/P PTCA and stenting of Ost LAD to Prox LAD with a Xience Alpine Rx 3.5 x 23 DES; stenosis reduced from 99% to 0% 3. Ischemic Cardiomyopathy; LVEF <40%  Recommendation: pt remains stable, echo pending. Will transfer to telemetry and consider  discharge tomorrow if he remains asymptomatic. Cardiac rehab. Significant improvement in EKG change. Amount of trop leak is miniscule compared to wall motion abnormalities. I expect complete recovery in LVEF.   Erling Conte, NP-C 10/03/2015, 8:27 AM [facility]  I have personally reviewed the patient's record and performed physical exam and agree with the assessment and plan of Ms. Marcy Salvo, NP-C.  Yates Decamp, MD 10/03/2015, 12:52 PM [facility] Pager: [phone] Office: [phone] If no answer: Cell:  [phone] Pager: [phone] Office: [phone]

## 2015-10-03 NOTE — Care Management Note (Signed)
Case Management Note  Patient Details  Name: Daniel Walter MRN: [id number] Date of Birth: December 10, 1955  Subjective/Objective:     Adm w mi               Action/Plan: lives w wife, pcp dr Felipa Eth   Expected Discharge Date:  10/04/15               Expected Discharge Plan:  Home/Self Care  In-House Referral:     Discharge planning Services  CM Consult, Medication Assistance  Post Acute Care Choice:    Choice offered to:     DME Arranged:    DME Agency:     HH Arranged:    HH Agency:     Status of Service:     If discussed at [employer], dates discussed:    Additional Comments: gave pt 30day free brilinta and copay card for brilinta.  Hanley Hays, RN 10/03/2015, 11:06 AM

## 2015-10-04 MED ORDER — TICAGRELOR 90 MG PO TABS
90.0000 mg | ORAL_TABLET | Freq: Two times a day (BID) | ORAL | 1 refills | Status: DC
Start: 2015-10-04 — End: 2018-05-01

## 2015-10-04 MED ORDER — ATORVASTATIN CALCIUM 80 MG PO TABS: 80.0000 mg | ORAL_TABLET | Freq: Every day | ORAL | 1 refills | Status: DC

## 2015-10-04 MED ORDER — ASPIRIN 81 MG PO CHEW: 81.0000 mg | CHEWABLE_TABLET | Freq: Every day | ORAL | 1 refills | Status: AC

## 2015-10-04 MED ORDER — VALSARTAN 40 MG PO TABS: 40.0000 mg | ORAL_TABLET | Freq: Every day | ORAL | Status: DC

## 2015-10-04 MED ORDER — CARVEDILOL 6.25 MG PO TABS: 6.2500 mg | ORAL_TABLET | Freq: Two times a day (BID) | ORAL | 1 refills | Status: DC

## 2015-10-04 NOTE — Discharge Summary (Signed)
Physician Discharge Summary  Patient ID: Daniel Walter MRN: [id number] DOB/AGE: September 21, 1955 60 y.o.  Admit date: 10/02/2015 Discharge date: 10/04/2015  Discharge Diagnoses: 1. Anterolateral MI 2. S/P PTCA and stenting of Ost LAD to Prox LAD with a Xience Alpine Rx 3.5 x 23 DES; stenosis reduced from 99% to 0% 3. Ischemic Cardiomyopathy; LVEF <40% 4. 7 beat run of NSVT on 10/03/2015, asymptomatic  Significant Diagnostic Studies: Coronary Angiogram 10/02/2015: Ost LAD to Prox LAD lesion, 99 %stenosed. XIENCE ALPINE RX [id number] drug eluting stent. Post intervention, there is a 0% residual stenosis. TIMI flow improved from TIMI 1 to TIMI 3 at end of the procedure. Otherwise normal coronary arteries. There is severe left ventricular systolic dysfunction. The left ventricular ejection fraction is less than 25% by visual estimate.   Echocardiogram 10/03/2015: LVEF 30-35%, akinesis of the entire anteroseptal, anterior, anterolateral, inferoseptal, and apical myocardium; in the distribution of the left anterior descending coronary artery.  Hospital Course:  He is a 60 year old male with no prior significant medical history.  He developed intermittent episodes of chest pain about one week ago, described as a burning sensation with radiation to the back that he had associated with GERD as he was able to relieve symptoms with belching.  He was initially seen in urgent care where EKG and chest x-ray were normal; he was started on a PPI discharged home.  He had continued to have worsening episodes of chest pain.  On 10/02/2015, he returned to urgent care where repeat EKG was again normal, however CBC revealed elevated white blood cell count and he was then sent to the emergency department.  A repeat EKG was then performed at the emergency department which revealed ST segment elevation in the anterior leads.  He was then taken emergently to the cath lab due to acute changes on EKG and persistent chest pain.  Coronary  angiogram revealed 99% stenosis of the ostial to proximal LAD.  He underwent successful PTCA and stenting with a Xience Alpine 3.5 x 23 mm DES.  LVEF visually 25% by coronary angiogram and echocardiogram performed the following day revealed LVEF of 30-35% with akinesis of the entire anteroseptal, anterior, anterolateral, inferoseptal, and apical myocardium.  Following coronary angiogram, he had remained asymptomatic, however he had a 7 beat run of NSVT on the evening of 10/03/2015. Given arrhythmia and ischemic cardiomyopathy, a LifeVest was placed prior to discharge for SCD primary prevention.   Recommendations on discharge: He has been started on ARB, BB, high dose high intensity statin and DAPT with ASA and Brilinta. Will continue DAPT for at least 1 year. Will repeat echocardiogram in 1 month. If EF improves, can consider early discontinuation of LifeVest.   Discharge Exam: Blood pressure 134/88, pulse 92, temperature 98.5 F (36.9 C), temperature source Oral, resp. rate 18, height 5\' 10"  (1.778 m), weight 86.4 kg (190 lb 8 oz), SpO2 98 %.    General appearance: alert, cooperative, appears stated age and no distress Eyes: negative findings: lids and lashes normal Neck: no adenopathy, no carotid bruit, no JVD, supple, symmetrical, trachea midline and thyroid not enlarged, symmetric, no tenderness/mass/nodules Resp: clear to auscultation bilaterally Chest wall: no tenderness Cardio: regular rate and rhythm, S1, S2 normal, no murmur, click, rub or gallop and Distant heart sounds GI: soft, non-tender; bowel sounds normal; no masses, no organomegaly Extremities: extremities normal, atraumatic, no cyanosis or edema Pulses: 2+ and symmetric; right radial access site asymptomatic Skin: Skin color, texture, turgor normal. No rashes or lesions Neurologic:  Grossly normal  Labs:   Lab Results  Component Value Date   WBC 13.2 (H) 10/03/2015   HGB 17.3 (H) 10/03/2015   HCT 48.4 10/03/2015   MCV 89.6  10/03/2015   PLT 292 10/03/2015    Recent Labs Lab 10/03/15 0659  NA 134*  K 4.2  CL 103  CO2 23  BUN 9  CREATININE 0.75  CALCIUM 8.9  GLUCOSE 211*    Lipid Panel     Component Value Date/Time   CHOL 122 10/02/2015 1834   TRIG 45 10/02/2015 1834   HDL 36 (L) 10/02/2015 1834   CHOLHDL 3.4 10/02/2015 1834   VLDL 9 10/02/2015 1834   LDLCALC 77 10/02/2015 1834    BNP (last 3 results) No results for input(s): BNP in the last 8760 hours.  HEMOGLOBIN A1C No results found for: HGBA1C, MPG  Cardiac Panel (last 3 results)  Recent Labs  10/02/15 1834 10/03/15 0024 10/03/15 0659  TROPONINI 2.63* 7.73* 8.09*    Lab Results  Component Value Date   TROPONINI 8.09 (HH) 10/03/2015     TSH  Recent Labs  10/02/15 1834  TSH 4.228   EKG 10/02/2015 at 1603: Sinus tachycardia at a rate of 126 bpm, ST elevation in V1 to V4 with reciprocal ST depression in inferior and lateral lead, acute injury pattern.  EKG 10/02/2015 at 1546: Normal sinus rhythm, early repolarization versus ST elevation in anterior leads, nonspecific ST segment changes.  EKG 10/03/2015: NSR. T Inversion anterolateral leads.   Radiology: Dg Chest Port 1 View  Result Date: 10/02/2015 CLINICAL DATA:  Chest pain, history GERD EXAM: PORTABLE CHEST 1 VIEW COMPARISON:  Portable exam 1601 hours compared to 09/22/2015 FINDINGS: Normal heart size, mediastinal contours, and pulmonary vascularity. Lungs clear. No pleural effusion or pneumothorax. Bones unremarkable. IMPRESSION: No acute abnormalities. Electronically Signed   By: Ulyses Southward M.D.   On: 10/02/2015 16:17      FOLLOW UP PLANS AND APPOINTMENTS    Medication List    TAKE these medications   alum & mag hydroxide-simeth 400-400-40 MG/5ML suspension Commonly known as:  MAALOX PLUS Take 5-15 mLs by mouth every 6 (six) hours as needed for indigestion.   aspirin 81 MG chewable tablet Chew 1 tablet (81 mg total) by mouth daily.   atorvastatin 80 MG  tablet Commonly known as:  LIPITOR Take 1 tablet (80 mg total) by mouth daily at 6 PM.   carvedilol 6.25 MG tablet Commonly known as:  COREG Take 1 tablet (6.25 mg total) by mouth 2 (two) times daily with a meal.   omeprazole 20 MG tablet Commonly known as:  PRILOSEC OTC Take 20 mg by mouth daily.   PROBIOTIC-10 Chew Chew 1 tablet by mouth daily. MILK-FREE   ranitidine 75 MG tablet Commonly known as:  ZANTAC Take 75 mg by mouth 2 (two) times daily as needed for heartburn.   Riboflavin 400 MG Caps Take 1 capsule by mouth daily.   ticagrelor 90 MG Tabs tablet Commonly known as:  BRILINTA Take 1 tablet (90 mg total) by mouth 2 (two) times daily.   Vitamin B-12 5000 MCG Tbdp Take 1 tablet by mouth daily.   Vitamin D3 5000 units Caps Take 1 capsule by mouth daily.      Follow-up Information    Erling Conte, NP Follow up on 10/12/2015.   Specialty:  Nurse Practitioner Why:  at 2:00pm. Arrive at 1:30pm. Please call if you need to change appointment.  Contact information: [address] [employer]  [address] [place] [address] [phone]           Erling Conte, NP-C 10/04/2015, 12:54 PM [facility] Pager: [phone] Office: [phone]

## 2015-10-04 NOTE — Care Management Note (Signed)
Case Management Note  Patient Details  Name: VIRGILIO FREUDENTHAL MRN: [id number] Date of Birth: April 24, 1955  Subjective/Objective: Pt presented for chest pain. Plan for home today- pt has Brilinta card and Life vest form faxed to company. Dr. Verl Dicker RN April to fit pt once approved by insurance.                    Action/Plan: [employer] to provide life vest. No further needs from CM at this time.    Expected Discharge Date:  10/04/15               Expected Discharge Plan:  Home/Self Care  In-House Referral:  NA  Discharge planning Services  CM Consult, Medication Assistance  Post Acute Care Choice:  Durable Medical Equipment Choice offered to:  Patient  DME Arranged:  Vest life vest DME Agency:  Other - Comment ([employer] life vest)  HH Arranged:  NA HH Agency:  NA  Status of Service:  Completed, signed off  If discussed at Long Length of Stay Meetings, dates discussed:    Additional Comments:  Gala Lewandowsky, RN 10/04/2015, 10:06 AM

## 2015-10-17 ENCOUNTER — Ambulatory Visit: Payer: Self-pay | Admitting: Allergy and Immunology

## 2016-04-03 DIAGNOSIS — G8929 Other chronic pain: Secondary | ICD-10-CM | POA: Diagnosis not present

## 2016-04-03 DIAGNOSIS — M25512 Pain in left shoulder: Secondary | ICD-10-CM | POA: Diagnosis not present

## 2016-04-03 DIAGNOSIS — M542 Cervicalgia: Secondary | ICD-10-CM | POA: Diagnosis not present

## 2016-04-03 DIAGNOSIS — M25511 Pain in right shoulder: Secondary | ICD-10-CM | POA: Diagnosis not present

## 2016-04-19 DIAGNOSIS — M25511 Pain in right shoulder: Secondary | ICD-10-CM | POA: Diagnosis not present

## 2016-04-19 DIAGNOSIS — G8929 Other chronic pain: Secondary | ICD-10-CM | POA: Diagnosis not present

## 2016-04-19 DIAGNOSIS — M25512 Pain in left shoulder: Secondary | ICD-10-CM | POA: Diagnosis not present

## 2016-04-26 ENCOUNTER — Ambulatory Visit (HOSPITAL_COMMUNITY)
Admission: RE | Admit: 2016-04-26 | Discharge: 2016-04-26 | Disposition: A | Discharge status: Home / Self Care | Payer: 59 | Source: Ambulatory Visit | Attending: Sports Medicine | Admitting: Sports Medicine

## 2016-04-26 ENCOUNTER — Other Ambulatory Visit (HOSPITAL_COMMUNITY): Payer: Self-pay | Admitting: Sports Medicine

## 2016-04-26 DIAGNOSIS — Z01818 Encounter for other preprocedural examination: Secondary | ICD-10-CM | POA: Diagnosis not present

## 2016-04-26 DIAGNOSIS — M25511 Pain in right shoulder: Secondary | ICD-10-CM

## 2016-04-27 DIAGNOSIS — M25511 Pain in right shoulder: Secondary | ICD-10-CM | POA: Diagnosis not present

## 2016-04-27 DIAGNOSIS — G8929 Other chronic pain: Secondary | ICD-10-CM | POA: Diagnosis not present

## 2016-05-06 DIAGNOSIS — M25511 Pain in right shoulder: Secondary | ICD-10-CM | POA: Diagnosis not present

## 2016-05-06 DIAGNOSIS — M542 Cervicalgia: Secondary | ICD-10-CM | POA: Diagnosis not present

## 2016-05-06 DIAGNOSIS — G8929 Other chronic pain: Secondary | ICD-10-CM | POA: Diagnosis not present

## 2016-07-01 DIAGNOSIS — M75111 Incomplete rotator cuff tear or rupture of right shoulder, not specified as traumatic: Secondary | ICD-10-CM | POA: Diagnosis not present

## 2016-07-01 DIAGNOSIS — M19011 Primary osteoarthritis, right shoulder: Secondary | ICD-10-CM | POA: Diagnosis not present

## 2016-08-23 DIAGNOSIS — E7800 Pure hypercholesterolemia, unspecified: Secondary | ICD-10-CM | POA: Diagnosis not present

## 2016-08-23 DIAGNOSIS — I2510 Atherosclerotic heart disease of native coronary artery without angina pectoris: Secondary | ICD-10-CM | POA: Diagnosis not present

## 2016-08-23 DIAGNOSIS — E119 Type 2 diabetes mellitus without complications: Secondary | ICD-10-CM | POA: Diagnosis not present

## 2016-09-05 DIAGNOSIS — Z9861 Coronary angioplasty status: Secondary | ICD-10-CM | POA: Diagnosis not present

## 2016-09-05 DIAGNOSIS — I252 Old myocardial infarction: Secondary | ICD-10-CM | POA: Diagnosis not present

## 2016-09-05 DIAGNOSIS — I2510 Atherosclerotic heart disease of native coronary artery without angina pectoris: Secondary | ICD-10-CM | POA: Diagnosis not present

## 2018-03-23 DIAGNOSIS — I10 Essential (primary) hypertension: Secondary | ICD-10-CM | POA: Diagnosis not present

## 2018-04-29 ENCOUNTER — Other Ambulatory Visit: Payer: Self-pay | Admitting: Cardiology

## 2018-04-29 DIAGNOSIS — I10 Essential (primary) hypertension: Secondary | ICD-10-CM

## 2018-05-01 ENCOUNTER — Encounter: Payer: Self-pay | Admitting: Cardiology

## 2018-05-01 ENCOUNTER — Ambulatory Visit: Payer: 59 | Admitting: Cardiology

## 2018-05-01 VITALS — BP 141/98 | HR 82 | Ht 70.0 in | Wt 190.4 lb

## 2018-05-01 DIAGNOSIS — I10 Essential (primary) hypertension: Secondary | ICD-10-CM | POA: Insufficient documentation

## 2018-05-01 DIAGNOSIS — Z789 Other specified health status: Secondary | ICD-10-CM

## 2018-05-01 DIAGNOSIS — E7800 Pure hypercholesterolemia, unspecified: Secondary | ICD-10-CM | POA: Insufficient documentation

## 2018-05-01 DIAGNOSIS — M791 Myalgia, unspecified site: Secondary | ICD-10-CM

## 2018-05-01 DIAGNOSIS — T466X5A Adverse effect of antihyperlipidemic and antiarteriosclerotic drugs, initial encounter: Secondary | ICD-10-CM | POA: Insufficient documentation

## 2018-05-01 DIAGNOSIS — I219 Acute myocardial infarction, unspecified: Secondary | ICD-10-CM | POA: Insufficient documentation

## 2018-05-01 DIAGNOSIS — N529 Male erectile dysfunction, unspecified: Secondary | ICD-10-CM | POA: Insufficient documentation

## 2018-05-01 DIAGNOSIS — I2510 Atherosclerotic heart disease of native coronary artery without angina pectoris: Secondary | ICD-10-CM | POA: Diagnosis not present

## 2018-05-01 DIAGNOSIS — I252 Old myocardial infarction: Secondary | ICD-10-CM | POA: Insufficient documentation

## 2018-05-01 MED ORDER — CARVEDILOL 6.25 MG PO TABS: 6.2500 mg | ORAL_TABLET | Freq: Two times a day (BID) | ORAL | 3 refills | Status: DC

## 2018-05-01 MED ORDER — VALSARTAN 160 MG PO TABS: 160.0000 mg | ORAL_TABLET | Freq: Every day | ORAL | 2 refills | Status: DC

## 2018-05-01 NOTE — Progress Notes (Addendum)
Subjective:   Daniel Walter, male    DOB: [birth year], 63 y.o.   MRN: [id number]  [place], [place]:  Chief Complaint  Patient presents with  . Hypertension    8 week f/u for HTN    HPI: Daniel Walter  is a 63 y.o. male  with past medical history of CAD, diet controlled diabetes, hypertension, hyperlipidemia, GERD, and erectile dysfunction. He underwent coronary angiogram on 10/02/2015 that revealed 99% stenosis of the ostial to proximal LAD and underwent successful PTCA and stenting with a Xience Alpine 3.5 x 23 mm DES. Has history of ischemic cardiomyopathy with LVEF of 25% that resolved by echocardiogram in Sept 2017.  Patient was last seen 8 weeks ago, due to hypertension he was started on Aldactone 25 mg daily.  He is intolerant to dairy, therefore cannot start hydrochlorothiazide with valsartan.  He has had myalgia symptoms to several statins in the past.  Ideally he is a good candidate for PCSK9 inhibitor; however, cost has been an issue.  He did not have labs performed.  He has not been checking blood pressure at home. Tolerating medications well. He reports that he had the flu early February. Tolerating Aldactone well. He was confused and stopped taking valsartan. No complaints today.     Past Medical History:  Diagnosis Date  . CAD (coronary artery disease)   . Coronary artery disease   . ED (erectile dysfunction)   . GERD (gastroesophageal reflux disease)   . Hypertension   . MI (myocardial infarction) ([place])   . Myalgia due to statin     Past Surgical History:  Procedure Laterality Date  . APPENDECTOMY    . CARDIAC CATHETERIZATION N/A 10/02/2015   Procedure: Left Heart Cath and Coronary Angiography;  Surgeon: Adrian Prows, MD;  Location: [facility];  Service: Cardiovascular;  Laterality: N/A;  . CARDIAC CATHETERIZATION N/A 10/02/2015   Procedure: Coronary Stent Intervention;  Surgeon: Adrian Prows, MD;  Location: [facility];  Service:  Cardiovascular;  Laterality: N/A;  Prox LAD  . CHOLECYSTECTOMY    . CORONARY ANGIOPLASTY    . KNEE ARTHROSCOPY      Family History  Problem Relation Age of Onset  . Heart attack Mother   . Aneurysm Father     Social History   Socioeconomic History  . Marital status: Married    Spouse name: Not on file  . Number of children: Not on file  . Years of education: Not on file  . Highest education level: Not on file  Occupational History  . Not on file  Social Needs  . Financial resource strain: Not on file  . Food insecurity:    Worry: Not on file    Inability: Not on file  . Transportation needs:    Medical: Not on file    Non-medical: Not on file  Tobacco Use  . Smoking status: Never Smoker  . Smokeless tobacco: Never Used  Substance and Sexual Activity  . Alcohol use: Not on file  . Drug use: No  . Sexual activity: Not on file  Lifestyle  . Physical activity:    Days per week: Not on file    Minutes per session: Not on file  . Stress: Not on file  Relationships  . Social connections:    Talks on phone: Not on file    Gets together: Not on file    Attends religious service: Not on file  Active member of club or organization: Not on file    Attends meetings of clubs or organizations: Not on file    Relationship status: Not on file  . Intimate partner violence:    Fear of current or ex partner: Not on file    Emotionally abused: Not on file    Physically abused: Not on file    Forced sexual activity: Not on file  Other Topics Concern  . Not on file  Social History Narrative  . Not on file    Current Meds  Medication Sig  . aspirin 81 MG chewable tablet Chew 1 tablet (81 mg total) by mouth daily.  . carvedilol (COREG) 6.25 MG tablet Take 1 tablet (6.25 mg total) by mouth 2 (two) times daily with a meal.  . Cholecalciferol (VITAMIN D3) 5000 units CAPS Take 1 capsule by mouth daily.  . Cyanocobalamin (VITAMIN B-12) 5000 MCG TBDP Take 1 tablet by mouth daily.   . sildenafil (REVATIO) 20 MG tablet Take 20 mg by mouth as needed.  Marland Kitchen spironolactone (ALDACTONE) 25 MG tablet TAKE 1 TABLET BY MOUTH EVERY DAY  . [DISCONTINUED] carvedilol (COREG) 6.25 MG tablet Take 1 tablet (6.25 mg total) by mouth 2 (two) times daily with a meal.     Review of Systems  Constitution: Negative for decreased appetite, malaise/fatigue, weight gain and weight loss.  Eyes: Negative for visual disturbance.  Cardiovascular: Negative for chest pain, claudication, dyspnea on exertion, leg swelling, orthopnea, palpitations and syncope.  Respiratory: Negative for hemoptysis and wheezing.   Endocrine: Negative for cold intolerance and heat intolerance.  Hematologic/Lymphatic: Does not bruise/bleed easily.  Skin: Negative for nail changes.  Musculoskeletal: Negative for muscle weakness and myalgias.  Gastrointestinal: Negative for abdominal pain, change in bowel habit, nausea and vomiting.  Neurological: Negative for difficulty with concentration, dizziness, focal weakness and headaches.  Psychiatric/Behavioral: Negative for altered mental status and suicidal ideas.  All other systems reviewed and are negative.      Objective:     Blood pressure (!) 141/98, pulse 82, height '5\' 10"'  (1.778 m), weight 190 lb 6.4 oz (86.4 kg), SpO2 98 %.  Echocardiogram 11/07/2015: Left ventricle cavity is normal in size. Mild concentric hypertrophy of the left ventricle. Normal diastolic filling pattern. Left ventricle regional wall motion findings: Apical anterior, Apical septal and Apical mild hypokinesis. Visual EF is 50-55%. Trace mitral regurgitation. Mild tricuspid regurgitation. No evidence of pulmonary hypertension. Compared to the echocardiogram 10/03/2015, EF improved from 30-35 percent.  Coronary angiogram 10/02/2015: 1. Ost LAD to Prox LAD lesion, 99 %stenosed. XIENCE ALPINE RX [id number] drug eluting stent. Post intervention, there is a 0% residual stenosis. TIMI flow improved from TIMI 1  to TIMI 3 at end of the procedure. Otherwise normal coronary arteries. 2. There is severe left ventricular systolic dysfunction. The left ventricular ejection fraction is less than 25% by visual estimate.  Labs 03/24/2018: Glucose 155, creatinine 0.95, EGFR 87/100, potassium 4.8, BMP otherwise normal.   Physical Exam  Constitutional: He is oriented to person, place, and time. Vital signs are normal. He appears well-developed and well-nourished.  HENT:  Head: Normocephalic and atraumatic.  Neck: Normal range of motion.  Cardiovascular: Normal rate, regular rhythm, normal heart sounds and intact distal pulses.  Pulmonary/Chest: Effort normal and breath sounds normal. No accessory muscle usage. No respiratory distress.  Abdominal: Soft. Bowel sounds are normal.  Musculoskeletal: Normal range of motion.  Neurological: He is alert and oriented to person, place, and time.  Skin: Skin  is warm and dry.  Vitals reviewed.          Assessment & Recommendations:   1. Essential hypertension Continues to be elevated.  I will restart valsartan 160 mg daily.  Discussed avoiding high potassium foods as he will be on both Aldactone and valsartan.  Will recheck CMP in 10 days for surveillance of kidney function.  Advised him to start daily monitoring of his blood pressure at home.  2. Coronary artery disease involving native coronary artery of native heart without angina pectoris He is without symptoms of angina.  Continue with aspirin daily.  3. Hypercholesteremia He has not tolerated several statins in the past.  LDL in June 2019 was not quite at goal with LDL of 80.  I will recheck his lipids in 10 days for continued surveillance.  He does not want to be on statin therapy.  He is also very reluctant to try other medications including Zetia or PCSK9 inhibitors.  We will further discuss at his next office visit.  4. Statin Intolerance Has resolved since being off of statin.  5. History of MI  (myocardial infarction) On appropriate medical therapy.   I will see him back in 6 weeks for follow-up on hypertension and to discuss lab results.    Jeri Lager, FNP-C [facility] [place] Office: [phone] Fax: [phone]

## 2018-05-13 ENCOUNTER — Telehealth: Payer: Self-pay

## 2018-05-13 NOTE — Telephone Encounter (Signed)
Pt called stating the valsartan is on back order and has been for 18 days and can we replace it with something else

## 2018-05-13 NOTE — Telephone Encounter (Signed)
Can do Olmesartan 40 mg daily

## 2018-05-14 NOTE — Telephone Encounter (Signed)
Spoke with pt and the pharmacy has gotten the valsartan in

## 2018-05-25 ENCOUNTER — Other Ambulatory Visit: Payer: Self-pay | Admitting: Cardiology

## 2018-05-25 DIAGNOSIS — I10 Essential (primary) hypertension: Secondary | ICD-10-CM

## 2018-05-28 ENCOUNTER — Other Ambulatory Visit: Payer: Self-pay | Admitting: Cardiology

## 2018-05-28 DIAGNOSIS — I2510 Atherosclerotic heart disease of native coronary artery without angina pectoris: Secondary | ICD-10-CM | POA: Diagnosis not present

## 2018-05-28 DIAGNOSIS — I10 Essential (primary) hypertension: Secondary | ICD-10-CM | POA: Diagnosis not present

## 2018-05-28 DIAGNOSIS — E7800 Pure hypercholesterolemia, unspecified: Secondary | ICD-10-CM | POA: Diagnosis not present

## 2018-05-29 LAB — COMPREHENSIVE METABOLIC PANEL
A/G RATIO: 2.1 (ref 1.2–2.2)
ALK PHOS: 80 IU/L (ref 39–117)
ALT: 13 IU/L (ref 0–44)
AST: 14 IU/L (ref 0–40)
Albumin: 4.7 g/dL (ref 3.8–4.9)
BILIRUBIN TOTAL: 0.7 mg/dL (ref 0.0–1.2)
BUN/Creatinine Ratio: 16 (ref 10–24)
BUN: 14 mg/dL (ref 8–27)
CHLORIDE: 100 mmol/L (ref 96–106)
CO2: 22 mmol/L (ref 20–29)
Calcium: 10 mg/dL (ref 8.6–10.2)
Creatinine, Ser: 0.89 mg/dL (ref 0.76–1.27)
GFR calc non Af Amer: 93 mL/min/{1.73_m2} (ref >59)
GFR, EST AFRICAN AMERICAN: 107 mL/min/{1.73_m2} (ref >59)
GLUCOSE: 176 mg/dL — AB (ref 65–99)
Globulin, Total: 2.2 g/dL (ref 1.5–4.5)
Potassium: 5.1 mmol/L (ref 3.5–5.2)
SODIUM: 140 mmol/L (ref 134–144)
Total Protein: 6.9 g/dL (ref 6.0–8.5)

## 2018-05-29 LAB — CBC WITH DIFFERENTIAL/PLATELET
BASOS ABS: 0.1 10*3/uL (ref 0.0–0.2)
Basos: 1 %
EOS (ABSOLUTE): 0.1 10*3/uL (ref 0.0–0.4)
Eos: 1 %
Hematocrit: 45.9 % (ref 37.5–51.0)
Hemoglobin: 15.7 g/dL (ref 13.0–17.7)
IMMATURE GRANS (ABS): 0 10*3/uL (ref 0.0–0.1)
Immature Granulocytes: 0 %
LYMPHS ABS: 1.5 10*3/uL (ref 0.7–3.1)
LYMPHS: 20 %
MCH: 31.6 pg (ref 26.6–33.0)
MCHC: 34.2 g/dL (ref 31.5–35.7)
MCV: 92 fL (ref 79–97)
Monocytes Absolute: 0.6 10*3/uL (ref 0.1–0.9)
Monocytes: 7 %
NEUTROS ABS: 5.5 10*3/uL (ref 1.4–7.0)
Neutrophils: 71 %
Platelets: 309 10*3/uL (ref 150–450)
RBC: 4.97 x10E6/uL (ref 4.14–5.80)
RDW: 11.8 % (ref 11.6–15.4)
WBC: 7.8 10*3/uL (ref 3.4–10.8)

## 2018-05-29 LAB — LIPID PANEL W/O CHOL/HDL RATIO
Cholesterol, Total: 156 mg/dL (ref 100–199)
HDL: 37 mg/dL — ABNORMAL LOW (ref >39)
LDL Calculated: 94 mg/dL (ref 0–99)
Triglycerides: 123 mg/dL (ref 0–149)
VLDL CHOLESTEROL CAL: 25 mg/dL (ref 5–40)

## 2018-06-06 ENCOUNTER — Other Ambulatory Visit: Payer: Self-pay

## 2018-06-06 ENCOUNTER — Emergency Department (HOSPITAL_BASED_OUTPATIENT_CLINIC_OR_DEPARTMENT_OTHER)
Admission: EM | Admit: 2018-06-06 | Discharge: 2018-06-07 | Disposition: A | Discharge status: Home / Self Care | Payer: 59 | Attending: Emergency Medicine | Admitting: Emergency Medicine

## 2018-06-06 ENCOUNTER — Emergency Department (HOSPITAL_BASED_OUTPATIENT_CLINIC_OR_DEPARTMENT_OTHER): Payer: 59

## 2018-06-06 ENCOUNTER — Encounter (HOSPITAL_BASED_OUTPATIENT_CLINIC_OR_DEPARTMENT_OTHER): Payer: Self-pay | Admitting: *Deleted

## 2018-06-06 DIAGNOSIS — R079 Chest pain, unspecified: Secondary | ICD-10-CM | POA: Diagnosis not present

## 2018-06-06 DIAGNOSIS — R42 Dizziness and giddiness: Secondary | ICD-10-CM | POA: Insufficient documentation

## 2018-06-06 DIAGNOSIS — I10 Essential (primary) hypertension: Secondary | ICD-10-CM | POA: Insufficient documentation

## 2018-06-06 DIAGNOSIS — Z7982 Long term (current) use of aspirin: Secondary | ICD-10-CM | POA: Diagnosis not present

## 2018-06-06 DIAGNOSIS — Z79899 Other long term (current) drug therapy: Secondary | ICD-10-CM | POA: Insufficient documentation

## 2018-06-06 DIAGNOSIS — I252 Old myocardial infarction: Secondary | ICD-10-CM | POA: Diagnosis not present

## 2018-06-06 DIAGNOSIS — I2510 Atherosclerotic heart disease of native coronary artery without angina pectoris: Secondary | ICD-10-CM | POA: Insufficient documentation

## 2018-06-06 LAB — CBC WITH DIFFERENTIAL/PLATELET
Abs Immature Granulocytes: 0.04 10*3/uL (ref 0.00–0.07)
Basophils Absolute: 0.1 10*3/uL (ref 0.0–0.1)
Basophils Relative: 1 %
Eosinophils Absolute: 0.1 10*3/uL (ref 0.0–0.5)
Eosinophils Relative: 1 %
HCT: 43.8 % (ref 39.0–52.0)
Hemoglobin: 15 g/dL (ref 13.0–17.0)
Immature Granulocytes: 0 %
Lymphocytes Relative: 16 %
Lymphs Abs: 1.5 10*3/uL (ref 0.7–4.0)
MCH: 31.6 pg (ref 26.0–34.0)
MCHC: 34.2 g/dL (ref 30.0–36.0)
MCV: 92.2 fL (ref 80.0–100.0)
Monocytes Absolute: 0.6 10*3/uL (ref 0.1–1.0)
Monocytes Relative: 7 %
Neutro Abs: 6.7 10*3/uL (ref 1.7–7.7)
Neutrophils Relative %: 75 %
Platelets: 275 10*3/uL (ref 150–400)
RBC: 4.75 MIL/uL (ref 4.22–5.81)
RDW: 11.5 % (ref 11.5–15.5)
WBC: 9 10*3/uL (ref 4.0–10.5)
nRBC: 0 % (ref 0.0–0.2)

## 2018-06-06 LAB — COMPREHENSIVE METABOLIC PANEL
ALT: 19 U/L (ref 0–44)
AST: 17 U/L (ref 15–41)
Albumin: 4.1 g/dL (ref 3.5–5.0)
Alkaline Phosphatase: 64 U/L (ref 38–126)
Anion gap: 6 (ref 5–15)
BUN: 17 mg/dL (ref 6–20)
CO2: 24 mmol/L (ref 22–32)
Calcium: 8.9 mg/dL (ref 8.9–10.3)
Chloride: 104 mmol/L (ref 98–111)
Creatinine, Ser: 0.82 mg/dL (ref 0.61–1.24)
GFR calc Af Amer: >60 mL/min (ref >60)
GFR calc non Af Amer: >60 mL/min (ref >60)
Glucose, Bld: 194 mg/dL — ABNORMAL HIGH (ref 70–99)
Potassium: 3.5 mmol/L (ref 3.5–5.1)
Sodium: 134 mmol/L — ABNORMAL LOW (ref 135–145)
Total Bilirubin: 0.6 mg/dL (ref 0.3–1.2)
Total Protein: 6.9 g/dL (ref 6.5–8.1)

## 2018-06-06 LAB — TROPONIN I
Troponin I: <0.03 ng/mL (ref <0.03)
Troponin I: <0.03 ng/mL (ref <0.03)

## 2018-06-06 NOTE — ED Provider Notes (Signed)
[facility] EMERGENCY DEPARTMENT Provider Note   CSN: [id number] Arrival date & time: 06/06/18  1920    History   Chief Complaint Chief Complaint  Patient presents with   Chest Pain    HPI Daniel Walter is a 63 y.o. male past medical history of CAD, MI (2017), hypertension who presents for evaluation of substernal/midsternal chest pain that began last night.  Patient states that last night he started experiencing some midsternal spasm type pain.  He states he did not get diaphoretic, differential have difficulty breathing with this pain.  He states it did not feel like his previous MI.  He states after a while, the pain got better.  He initially thought it was related to either anxiety or something he ate earlier that morning.  He states that today, he started having the pain again in about 8 or 9 AM.  He states since then, the pain has been intermittent.  He states that the pain occurs randomly and is not worse with deep inspiration or exertion.  He has not had any diaphoresis or associated vomiting.  He describes a "woozy or lightheaded sensation."  He denies any room spinning sensation.  He states this does not feel similar to his previous MIs.  Patient states that the pain is in the substernal area and radiates up the midsternal.  He describes as a spasm type pain.  Denies any burning pain.  He states he ate some sauerkraut today and it made it better.  He is not taking any other medications.  He does report that he recently was restarted on valsartan after being off of it for several months.  Denies any other new medications.  He is not a current smoker.  He has history of hypertension but no diabetes.  He states that he has been compliant with his carvedilol, ASA.  Patient states every once in a while, he has pain in his shoulder related to a rotator cuff injury but denies any back pain currently. Patient denies any fevers, vision changes, numbness/weakness of his arms or legs,  vomiting, abdominal pain.      The history is provided by the patient.    Past Medical History:  Diagnosis Date   CAD (coronary artery disease)    Coronary artery disease    ED (erectile dysfunction)    GERD (gastroesophageal reflux disease)    Hypertension    MI (myocardial infarction) (HCC)    Myalgia due to statin     Patient Active Problem List   Diagnosis Date Noted   Hypercholesteremia 05/01/2018   Essential hypertension 05/01/2018   Myalgia due to statin 05/01/2018   History of MI (myocardial infarction) 05/01/2018   MI (myocardial infarction) [facility])    CAD (coronary artery disease)    ED (erectile dysfunction)    CAD (coronary artery disease), native coronary artery 10/02/2015    Past Surgical History:  Procedure Laterality Date   APPENDECTOMY     CARDIAC CATHETERIZATION N/A 10/02/2015   Procedure: Left Heart Cath and Coronary Angiography;  Surgeon: Yates Decamp, MD;  Location: [facility] INVASIVE CV LAB;  Service: Cardiovascular;  Laterality: N/A;   CARDIAC CATHETERIZATION N/A 10/02/2015   Procedure: Coronary Stent Intervention;  Surgeon: Yates Decamp, MD;  Location: [facility] INVASIVE CV LAB;  Service: Cardiovascular;  Laterality: N/A;  Prox LAD   CHOLECYSTECTOMY     CORONARY ANGIOPLASTY     KNEE ARTHROSCOPY          Home Medications  Prior to Admission medications   Medication Sig Start Date End Date Taking? Authorizing Provider  aspirin 81 MG chewable tablet Chew 1 tablet (81 mg total) by mouth daily. 10/04/15  Yes Marcy Salvo, NP  carvedilol (COREG) 6.25 MG tablet Take 1 tablet (6.25 mg total) by mouth 2 (two) times daily with a meal. 05/01/18  Yes Toniann Fail, NP  omeprazole (PRILOSEC) 40 MG capsule Take 40 mg by mouth daily.   Yes [provider]  spironolactone (ALDACTONE) 25 MG tablet TAKE 1 TABLET BY MOUTH EVERY DAY 05/25/18  Yes Toniann Fail, NP  valsartan (DIOVAN) 160 MG tablet Take 1 tablet (160 mg total) by mouth  daily. 05/01/18  Yes Toniann Fail, NP  Cholecalciferol (VITAMIN D3) 5000 units CAPS Take 1 capsule by mouth daily.    [provider]  Cyanocobalamin (VITAMIN B-12) 5000 MCG TBDP Take 1 tablet by mouth daily.    [provider]  sildenafil (REVATIO) 20 MG tablet Take 20 mg by mouth as needed. 02/07/18   [provider]    Family History Family History  Problem Relation Age of Onset   Heart attack Mother    Aneurysm Father     Social History Social History   Tobacco Use   Smoking status: Never Smoker   Smokeless tobacco: Never Used  Substance Use Topics   Alcohol use: Not on file   Drug use: No     Allergies   Milk-related compounds   Review of Systems Review of Systems  Constitutional: Negative for fever.  Respiratory: Negative for cough and shortness of breath.   Cardiovascular: Positive for chest pain.  Gastrointestinal: Negative for abdominal pain, nausea and vomiting.  Genitourinary: Negative for dysuria and hematuria.  Neurological: Positive for light-headedness. Negative for weakness, numbness and headaches.  All other systems reviewed and are negative.    Physical Exam Updated Vital Signs BP (!) 132/93 (BP Location: Right Arm)    Pulse 75    Temp 98.4 F (36.9 C) (Oral)    Resp (!) 22    Ht 5\' 10"  (1.778 m)    Wt 85.7 kg    SpO2 98%    BMI 27.12 kg/m   Physical Exam Vitals signs and nursing note reviewed.  Constitutional:      Appearance: Normal appearance. He is well-developed.  HENT:     Head: Normocephalic and atraumatic.  Eyes:     General: Lids are normal.     Extraocular Movements: Extraocular movements intact.     Conjunctiva/sclera: Conjunctivae normal.     Pupils: Pupils are equal, round, and reactive to light.     Comments: EOMs intact. No nystagmus. PERRL.   Neck:     Musculoskeletal: Full passive range of motion without pain.  Cardiovascular:     Rate and Rhythm: Normal rate and regular rhythm.      Pulses: Normal pulses.          Radial pulses are 2+ on the right side and 2+ on the left side.       Dorsalis pedis pulses are 2+ on the right side and 2+ on the left side.     Heart sounds: Normal heart sounds. No murmur. No friction rub. No gallop.   Pulmonary:     Effort: Pulmonary effort is normal.     Breath sounds: Normal breath sounds.     Comments: Lungs clear to auscultation bilaterally.  Symmetric chest rise.  No wheezing, rales, rhonchi. Abdominal:  Palpations: Abdomen is soft. Abdomen is not rigid.     Tenderness: There is no abdominal tenderness. There is no guarding.     Comments: Abdomen is soft, non-distended, non-tender. No rigidity, No guarding. No peritoneal signs.  Musculoskeletal: Normal range of motion.  Skin:    General: Skin is warm and dry.     Capillary Refill: Capillary refill takes less than 2 seconds.  Neurological:     Mental Status: He is alert and oriented to person, place, and time.  Psychiatric:        Speech: Speech normal.      ED Treatments / Results  Labs (all labs ordered are listed, but only abnormal results are displayed) Labs Reviewed  COMPREHENSIVE METABOLIC PANEL - Abnormal; Notable for the following components:      Result Value   Sodium 134 (*)    Glucose, Bld 194 (*)    All other components within normal limits  CBC WITH DIFFERENTIAL/PLATELET  TROPONIN I  TROPONIN I    EKG EKG Interpretation  Date/Time:  Saturday June 06 2018 19:32:22 EDT Ventricular Rate:  83 PR Interval:    QRS Duration: 93 QT Interval:  378 QTC Calculation: 445 R Axis:   37 Text Interpretation:  Sinus rhythm since last tracing no significant change Confirmed by Rolan Bucco [phone]) on 06/06/2018 8:41:02 PM Also confirmed by Rolan Bucco [id number]), editor Barbette Hair [phone])  on 06/07/2018 6:56:29 AM   Radiology Dg Chest 2 View  Result Date: 06/06/2018 CLINICAL DATA:  63 year old male with chest pain. EXAM: CHEST - 2 VIEW COMPARISON:  Chest  radiograph dated 10/02/2015 FINDINGS: The lungs are clear. There is no pleural effusion pneumothorax. The cardiac silhouette is within normal limits. The thoracic aorta is tortuous. No acute osseous pathology. IMPRESSION: No active cardiopulmonary disease. Electronically Signed   By: Elgie Collard M.D.   On: 06/06/2018 20:20    Procedures Procedures (including critical care time)  Medications Ordered in ED Medications - No data to display   Initial Impression / Assessment and Plan / ED Course  I have reviewed the triage vital signs and the nursing notes.  Pertinent labs & imaging results that were available during my care of the patient were reviewed by me and considered in my medical decision making (see chart for details).        63 y.o. M proximal history of CAD, MI (2017) who presents for evaluation of midsternal and substernal chest pain that began last night.  He reports that pain improved last night.  He states that today, he woke up and was fine but then about 8 or 9 AM, he started developing the pain again.  He states pain is intermittent.  He states that it occurs randomly and is not worse with deep inspiration or exertion.  He describes a woozy sensation.  But denies any room spinning sensation, difficulty breathing.  Reports he had recent change to his blood pressure medications.  He states he has been compliant with his medications. Patient is afebrile, non-toxic appearing, sitting comfortably on examination table. Vital signs reviewed and stable.  Currently denies any symptoms at this time.  Concern for ACS etiology given patient's history and risk factors.  Consider musculoskeletal pain, GERD, anxiety.  States he does have panic attacks and states this feels somewhat similar to his panic attack.  History/physical exam not concerning for PE.  History/physical exam not concerning for aortic dissection. History/physical exam not concerning for CVA.  He has no neurological deficits  or gait ataxia.  No indication  for head CT.  CBC unremarkable.  CMP is unremarkable.  Initial troponin negative.  Chest x-ray negative for any infectious etiology.  Patient's risk factors, presentation he has a heart score of 3.  We will plan to delta troponin.  Updated patient on plan.  He is agreeable.  Delta troponin negative.  Discussed results with patient.  Patient is resting comfortably on bed.  Patient is still slightly hypertensive.  He does report being anxious.   Patient states that he will occasionally have the pain but states that it is not consistent.  He is sitting at rest with no signs of distress.  Instructed him to follow-up with his cardiologist as directed. At this time, patient exhibits no emergent life-threatening condition that require further evaluation in ED or admission. Patient had ample opportunity for questions and discussion. All patient's questions were answered with full understanding. Strict return precautions discussed. Patient expresses understanding and agreement to plan.   Portions of this note were generated with Scientist, clinical (histocompatibility and immunogenetics). Dictation errors may occur despite best attempts at proofreading.   Final Clinical Impressions(s) / ED Diagnoses   Final diagnoses:  Nonspecific chest pain    ED Discharge Orders    None       Rosana Hoes 06/07/18 [id number]    Rolan Bucco, MD 06/07/18 2243

## 2018-06-06 NOTE — ED Notes (Signed)
ED Provider at bedside. 

## 2018-06-06 NOTE — ED Notes (Signed)
Patient transported to X-ray 

## 2018-06-06 NOTE — ED Triage Notes (Signed)
Pt reports substernal CP that started last night. Reports that he thinks he has indigestion. Denies SOB, N/V.

## 2018-06-06 NOTE — Discharge Instructions (Signed)
Follow-up with your cardiologist.  Return to the Emergency Department immediately if you experiencing worsening chest pain, difficulty breathing, nausea/vomiting, get very sweaty, headache or any other worsening or concerning symptoms.

## 2018-06-06 NOTE — ED Notes (Signed)
Pt remains on cardiac monitor and auto VS 

## 2018-06-06 NOTE — ED Notes (Signed)
Pt on cardiac monitor and auto VS 

## 2018-06-08 ENCOUNTER — Telehealth: Payer: Self-pay

## 2018-06-08 NOTE — Telephone Encounter (Signed)
Pt called c/o going to er for cp; everything was fine; He says that the valsartan has milk in it and it been messing his body up; Can we please change to something else ? Please advise

## 2018-06-09 NOTE — Telephone Encounter (Signed)
Please discontinue Coreg, switch him ot Bystolic and give him a coupon, 20 mg daily. He can come in for a check in the office next week.

## 2018-06-10 NOTE — Telephone Encounter (Signed)
Awaiting call back.

## 2018-06-15 ENCOUNTER — Encounter: Payer: Self-pay | Admitting: Cardiology

## 2018-06-15 ENCOUNTER — Other Ambulatory Visit: Payer: Self-pay

## 2018-06-15 ENCOUNTER — Ambulatory Visit (INDEPENDENT_AMBULATORY_CARE_PROVIDER_SITE_OTHER): Payer: 59 | Admitting: Cardiology

## 2018-06-15 VITALS — BP 136/85

## 2018-06-15 DIAGNOSIS — I2510 Atherosclerotic heart disease of native coronary artery without angina pectoris: Secondary | ICD-10-CM

## 2018-06-15 DIAGNOSIS — E7800 Pure hypercholesterolemia, unspecified: Secondary | ICD-10-CM

## 2018-06-15 DIAGNOSIS — I10 Essential (primary) hypertension: Secondary | ICD-10-CM

## 2018-06-15 MED ORDER — NEBIVOLOL HCL 10 MG PO TABS: 10.0000 mg | ORAL_TABLET | Freq: Every day | ORAL | 1 refills | Status: DC

## 2018-06-15 NOTE — Progress Notes (Signed)
Subjective:   Daniel Walter, male    DOB: 03-01-1956, 63 y.o.   MRN: [id number]  Patient, No Pcp Per:  Chief Complaint  Patient presents with  . Hypertension  . Hyperlipidemia   This visit type was conducted due to national recommendations for restrictions regarding the COVID-19 Pandemic (e.g. social distancing).  This format is felt to be most appropriate for this patient at this time.  All issues noted in this document were discussed and addressed.  No physical exam was performed (except for noted visual exam findings with Telehealth visits).  The patient has consented to conduct a Telehealth visit and understands insurance will be billed.   I discussed the limitations of evaluation and management by telemedicine and the availability of in person appointments. The patient expressed understanding and agreed to proceed.  Virtual Visit via Video Note is as below  I connected with Daniel Walter, on 06/15/18 at 0900 by a video enabled telemedicine application and verified that I am speaking with the correct person using two identifiers.     I have discussed with her regarding the safety during COVID Pandemic and steps and precautions including social distancing with the patient.    HPI: Daniel Walter  is a 63 y.o. male  with past medical history of CAD, diet controlled diabetes, hypertension, hyperlipidemia, GERD, and erectile dysfunction. He underwent coronary angiogram on 10/02/2015 that revealed 99% stenosis of the ostial to proximal LAD and underwent successful PTCA and stenting with a Xience Alpine 3.5 x 23 mm DES. Has history of ischemic cardiomyopathy with LVEF of 25% that resolved by echocardiogram in Sept 2017.  Patient was last seen 6 weeks ago, due to hypertension he was restarted on Valsartan.  He is intolerant to dairy, therefore cannot start hydrochlorothiazide with valsartan. Reports his recent prescription of Valsartan was from a different manufacturer and contained  dairy. He was seen in the ER on 06/06/2018 after he developed chest pressure and anxiety. Troponin was negative. He later found out about the dairy in his valsartan and since stopping the medication, he has not had any further symptoms. He has been monitoring his BP at home and for the last few days has been in the 120's systolic.  He has had myalgia symptoms to several statins in the past.  Ideally he is a good candidate for PCSK9 inhibitor; however, cost has been an issue.  Recent lipids in March 2020 showed LDL increased to 94.   He has not been checking blood pressure at home. Tolerating medications well. No complaints today.     Past Medical History:  Diagnosis Date  . CAD (coronary artery disease)   . Coronary artery disease   . ED (erectile dysfunction)   . GERD (gastroesophageal reflux disease)   . Hypertension   . MI (myocardial infarction) (HCC)   . Myalgia due to statin     Past Surgical History:  Procedure Laterality Date  . APPENDECTOMY    . CARDIAC CATHETERIZATION N/A 10/02/2015   Procedure: Left Heart Cath and Coronary Angiography;  Surgeon: Yates Decamp, MD;  Location: [facility] INVASIVE CV LAB;  Service: Cardiovascular;  Laterality: N/A;  . CARDIAC CATHETERIZATION N/A 10/02/2015   Procedure: Coronary Stent Intervention;  Surgeon: Yates Decamp, MD;  Location: [facility] INVASIVE CV LAB;  Service: Cardiovascular;  Laterality: N/A;  Prox LAD  . CHOLECYSTECTOMY    . CORONARY ANGIOPLASTY    . KNEE ARTHROSCOPY      Family History  Problem Relation  Age of Onset  . Heart attack Mother   . Aneurysm Father     Social History   Socioeconomic History  . Marital status: Married    Spouse name: Not on file  . Number of children: Not on file  . Years of education: Not on file  . Highest education level: Not on file  Occupational History  . Not on file  Social Needs  . Financial resource strain: Not on file  . Food insecurity:    Worry: Not on file    Inability: Not on file  .  Transportation needs:    Medical: Not on file    Non-medical: Not on file  Tobacco Use  . Smoking status: Never Smoker  . Smokeless tobacco: Never Used  Substance and Sexual Activity  . Alcohol use: Not on file  . Drug use: No  . Sexual activity: Not on file  Lifestyle  . Physical activity:    Days per week: Not on file    Minutes per session: Not on file  . Stress: Not on file  Relationships  . Social connections:    Talks on phone: Not on file    Gets together: Not on file    Attends religious service: Not on file    Active member of club or organization: Not on file    Attends meetings of clubs or organizations: Not on file    Relationship status: Not on file  . Intimate partner violence:    Fear of current or ex partner: Not on file    Emotionally abused: Not on file    Physically abused: Not on file    Forced sexual activity: Not on file  Other Topics Concern  . Not on file  Social History Narrative  . Not on file    Current Meds  Medication Sig  . aspirin 81 MG chewable tablet Chew 1 tablet (81 mg total) by mouth daily.  . Cholecalciferol (VITAMIN D3) 5000 units CAPS Take 1 capsule by mouth daily.  Marland Kitchen omeprazole (PRILOSEC) 40 MG capsule Take 40 mg by mouth daily.  . sildenafil (REVATIO) 20 MG tablet Take 20 mg by mouth as needed.  Marland Kitchen spironolactone (ALDACTONE) 25 MG tablet TAKE 1 TABLET BY MOUTH EVERY DAY  . [DISCONTINUED] carvedilol (COREG) 6.25 MG tablet Take 1 tablet (6.25 mg total) by mouth 2 (two) times daily with a meal.     Review of Systems  Constitution: Negative for decreased appetite, malaise/fatigue, weight gain and weight loss.  Eyes: Negative for visual disturbance.  Cardiovascular: Negative for chest pain, claudication, dyspnea on exertion, leg swelling, orthopnea, palpitations and syncope.  Respiratory: Negative for hemoptysis and wheezing.   Endocrine: Negative for cold intolerance and heat intolerance.  Hematologic/Lymphatic: Does not  bruise/bleed easily.  Skin: Negative for nail changes.  Musculoskeletal: Negative for muscle weakness and myalgias.  Gastrointestinal: Negative for abdominal pain, change in bowel habit, nausea and vomiting.  Neurological: Negative for difficulty with concentration, dizziness, focal weakness and headaches.  Psychiatric/Behavioral: Negative for altered mental status and suicidal ideas.  All other systems reviewed and are negative.      Objective:     Blood pressure 136/85.  Echocardiogram 11/07/2015: Left ventricle cavity is normal in size. Mild concentric hypertrophy of the left ventricle. Normal diastolic filling pattern. Left ventricle regional wall motion findings: Apical anterior, Apical septal and Apical mild hypokinesis. Visual EF is 50-55%. Trace mitral regurgitation. Mild tricuspid regurgitation. No evidence of pulmonary hypertension. Compared to the echocardiogram  10/03/2015, EF improved from 30-35 percent.  Coronary angiogram 10/02/2015: 1. Ost LAD to Prox LAD lesion, 99 %stenosed. XIENCE ALPINE RX [id number] drug eluting stent. Post intervention, there is a 0% residual stenosis. TIMI flow improved from TIMI 1 to TIMI 3 at end of the procedure. Otherwise normal coronary arteries. 2. There is severe left ventricular systolic dysfunction. The left ventricular ejection fraction is less than 25% by visual estimate.  Labs: CBC    Component Value Date/Time   WBC 9.0 06/06/2018 1954   RBC 4.75 06/06/2018 1954   HGB 15.0 06/06/2018 1954   HGB 15.7 05/28/2018 0919   HCT 43.8 06/06/2018 1954   HCT 45.9 05/28/2018 0919   PLT 275 06/06/2018 1954   PLT 309 05/28/2018 0919   MCV 92.2 06/06/2018 1954   MCV 92 05/28/2018 0919   MCH 31.6 06/06/2018 1954   MCHC 34.2 06/06/2018 1954   RDW 11.5 06/06/2018 1954   RDW 11.8 05/28/2018 0919   LYMPHSABS 1.5 06/06/2018 1954   LYMPHSABS 1.5 05/28/2018 0919   MONOABS 0.6 06/06/2018 1954   EOSABS 0.1 06/06/2018 1954   EOSABS 0.1 05/28/2018 0919    BASOSABS 0.1 06/06/2018 1954   BASOSABS 0.1 05/28/2018 0919    CMP     Component Value Date/Time   NA 134 (L) 06/06/2018 1954   NA 140 05/28/2018 0919   K 3.5 06/06/2018 1954   CL 104 06/06/2018 1954   CO2 24 06/06/2018 1954   GLUCOSE 194 (H) 06/06/2018 1954   BUN 17 06/06/2018 1954   BUN 14 05/28/2018 0919   CREATININE 0.82 06/06/2018 1954   CALCIUM 8.9 06/06/2018 1954   PROT 6.9 06/06/2018 1954   PROT 6.9 05/28/2018 0919   ALBUMIN 4.1 06/06/2018 1954   ALBUMIN 4.7 05/28/2018 0919   AST 17 06/06/2018 1954   ALT 19 06/06/2018 1954   ALKPHOS 64 06/06/2018 1954   BILITOT 0.6 06/06/2018 1954   BILITOT 0.7 05/28/2018 0919   GFRNONAA >60 06/06/2018 1954   GFRAA >60 06/06/2018 1954   Lipid Panel     Component Value Date/Time   CHOL 156 05/28/2018 0919   TRIG 123 05/28/2018 0919   HDL 37 (L) 05/28/2018 0919   CHOLHDL 3.4 10/02/2015 1834   VLDL 9 10/02/2015 1834   LDLCALC 94 05/28/2018 0919      Physical Exam  Constitutional: He is oriented to person, place, and time. He appears well-developed and well-nourished. No distress.  HENT:  Head: Normocephalic.  Neck: No thyromegaly present.  Pulmonary/Chest: Effort normal. No respiratory distress.  Neurological: He is alert and oriented to person, place, and time.  Psychiatric: He has a normal mood and affect. His behavior is normal.  Vitals reviewed.       Assessment & Recommendations:   1. Essential hypertension Blood pressure has been well controlled recently. He had recently contacted our office about stopping his Valsartan and it was recommended that he stop his Coreg and start Bystolic. I will make this change today to see if his blood pressure is more better controlled. Continue with Aldactone. His sodium was slightly low on 04/04 at 134 and stopping the valsartan may help with this. He will continue to monitor at home.   2. Hypercholesteremia LDL has slightly increased compared to before. He has not tolerated  statins and PCSK9 inhibitor are to expensive for him. He is open to trying Zetia. He wishes to wait on starting this until he knows that he will tolerate the Bystolic. He will contact  the office in a few weeks and let us know and if tolerating Bystolic, will start Zetia at that time.  3. Coronary artery disease involving native coronary artery of native heart without angina pectoris Recently seen in ER for chest pain. Negative troponin and felt to be related to dairy product in his Valsartan as he is intolerant to dairy. Encouraged him to contact me if he has reoccurrence.   Plan: I will see him back in 2-3 months for follow up on HTN and HLD.  Altamese Jefferson Davis, FNP-C [facility] Office: [phone] Fax: [phone]

## 2018-06-17 ENCOUNTER — Other Ambulatory Visit: Payer: Self-pay | Admitting: Cardiology

## 2018-06-17 NOTE — Telephone Encounter (Signed)
Per [place] pt is to just cont coreg and spironolactone and keep an eye on his bp and if we have to change it again we will call pharmacy and see what we can put him on that doesn't contain milk.

## 2018-06-18 ENCOUNTER — Other Ambulatory Visit: Payer: Self-pay | Admitting: Cardiology

## 2018-06-18 DIAGNOSIS — I10 Essential (primary) hypertension: Secondary | ICD-10-CM

## 2018-06-19 NOTE — Telephone Encounter (Signed)
Please fill

## 2018-06-23 ENCOUNTER — Telehealth: Payer: Self-pay

## 2018-06-23 NOTE — Telephone Encounter (Signed)
Pt called to give update on bp readings due to being told that we would just hold off until his next ov 7/20 bc of dairy in previous bp meds sent to pharmacy. He is wants to know when he should be concerned as far as bp readings. This morning 135/92 and just now 138/95 w/ 95 hr. Please advise.//ah

## 2018-06-23 NOTE — Telephone Encounter (Signed)
Yes coreg 6.25mg  bid and aldactone 25 qd

## 2018-06-23 NOTE — Telephone Encounter (Signed)
Is he back on the Coreg and the Aldactone?

## 2018-06-23 NOTE — Telephone Encounter (Signed)
Increase Coreg to 12.5 mg twice a day. I would like for him to also go back on the valsartan that he was previously on. I cannot find that valsartan contains dairy, but we will need to check with pharmacy.

## 2018-06-24 ENCOUNTER — Other Ambulatory Visit: Payer: Self-pay

## 2018-06-24 ENCOUNTER — Other Ambulatory Visit: Payer: Self-pay | Admitting: Cardiology

## 2018-06-24 MED ORDER — CARVEDILOL 12.5 MG PO TABS: 12.5000 mg | ORAL_TABLET | Freq: Two times a day (BID) | ORAL | 1 refills | Status: DC

## 2018-06-24 MED ORDER — VALSARTAN 80 MG PO TABS: 80.0000 mg | ORAL_TABLET | Freq: Every day | ORAL | 1 refills | Status: DC

## 2018-06-24 NOTE — Telephone Encounter (Signed)
Can we call the pharmacy and see if they have any recommendations for a medication that we can use that does not contain dairy

## 2018-06-24 NOTE — Telephone Encounter (Signed)
Pt aware of increase and new rx sent to pharmacy. He said that he looked online and it said that valsartan contained dairy and he verified it with the pharmacist...please advise.//ah

## 2018-06-24 NOTE — Progress Notes (Signed)
Discussed with patient and pharmacist over the phone. All medications will contain small amount of dairy. He previously tolerated valsartan when initially started, but when he received refill from a different manufacturer, he was unable to tolerate. Will try to get original prescription from manufacturer.

## 2018-06-29 ENCOUNTER — Other Ambulatory Visit: Payer: Self-pay | Admitting: Cardiology

## 2018-06-29 MED ORDER — DIOVAN 80 MG PO TABS
80.0000 mg | ORAL_TABLET | Freq: Every day | ORAL | 1 refills | Status: DC
Start: 2018-06-29 — End: 2018-08-05

## 2018-07-26 ENCOUNTER — Other Ambulatory Visit: Payer: Self-pay | Admitting: Cardiology

## 2018-07-30 ENCOUNTER — Other Ambulatory Visit: Payer: Self-pay | Admitting: Cardiology

## 2018-07-30 MED ORDER — SILDENAFIL CITRATE 20 MG PO TABS: 20.0000 mg | ORAL_TABLET | ORAL | 3 refills | Status: DC | PRN

## 2018-08-03 ENCOUNTER — Other Ambulatory Visit: Payer: Self-pay | Admitting: Cardiology

## 2018-08-03 DIAGNOSIS — I10 Essential (primary) hypertension: Secondary | ICD-10-CM

## 2018-08-05 ENCOUNTER — Telehealth: Payer: Self-pay

## 2018-08-05 ENCOUNTER — Telehealth: Payer: Self-pay | Admitting: Cardiology

## 2018-08-05 ENCOUNTER — Other Ambulatory Visit: Payer: Self-pay

## 2018-08-05 DIAGNOSIS — I10 Essential (primary) hypertension: Secondary | ICD-10-CM

## 2018-08-05 MED ORDER — DIOVAN 80 MG PO TABS: 80.0000 mg | ORAL_TABLET | Freq: Every day | ORAL | 1 refills | Status: DC

## 2018-08-05 NOTE — Telephone Encounter (Signed)
Contacted [facility] [phone] [facility], pharmacist) stated that this particular brand of Valsartan is an ARB  Drug has a filler inside from [employer] that may have been may recalled, stated that she would try to look on line to check, verified that it was recalled,requested a contact number for [employer] [phone] for samples, spk with Christin from [employer] advised that its no longer an active product for that Generic division. Contacted pharmacy again to request if there was a substitute nondairy product that patient can use, advised that he could use a liquid Epaned( Ace Inhibitor). Spoke to PA will contact patient.

## 2018-08-05 NOTE — Telephone Encounter (Signed)
Contacted [facility] [phone] ([place], pharmacist) stated that this particular brand of Valsartan is an ARB  Drug has a filler inside from [employer] that may have been may recalled, stated that she would try to look on line to check, verified that it was recalled,requested a contact number for [employer] [phone] for samples, spk with Christin from [employer] advised that its no longer an active product for that Generic division. Contacted pharmacy again to request if there was a substitute nondairy product that patient can use, advised that he could use a liquid Epaned( Ace Inhibitor). Spoke to PA will contact patient.  

## 2018-08-05 NOTE — Telephone Encounter (Signed)
Called patient today as he was upset due to delay in getting information regarding his Valsartan. He has history of severe intolerances to dairy and has recently had significant life altering symptoms while on Valsartan. We have tried to get approval for brand name Diovan (per patient does not have lactose ingredient); however, have been denied. After lengthy discussion with the patient, he has bought Diovan out of pocket and has been tolerating it well. We will again try to get approval for this. Since last seen by me he has also stopped Coreg because it also contains dairy. If we are not able to get Diovan his only option would be to use liquid form Enalapril, if we cannot get approval. I will let him know once we receive information back from insurance. He will likely benefit from being evaluated by Allergist in view of severe dairy intolerance.

## 2018-08-05 NOTE — Telephone Encounter (Signed)
Sent Rx to [facility] for patient ( Diovan 80mg ), spk to pt to make aware advised if any symptoms to contact us.

## 2018-08-13 NOTE — Telephone Encounter (Signed)
Wasn't aware that a PA was needed medication was sent to the pharmacy and pt picked it up. As far as I know from the patient a PA wasn't requested as of yet. Patient was supposed to contact if any issues come up with medication.

## 2018-08-14 NOTE — Telephone Encounter (Signed)
Please read my previous message. Will need to try to get diovan approved as he is tolerating it without side effects.

## 2018-08-19 ENCOUNTER — Telehealth: Payer: Self-pay

## 2018-08-19 NOTE — Telephone Encounter (Signed)
Pt stated that he has stopped taken the Carvedilol it has dairy in it.

## 2018-08-19 NOTE — Telephone Encounter (Signed)
Yes, he told me when we talked over the phone last

## 2018-08-21 ENCOUNTER — Other Ambulatory Visit: Payer: Self-pay | Admitting: Cardiology

## 2018-08-21 DIAGNOSIS — I10 Essential (primary) hypertension: Secondary | ICD-10-CM

## 2018-08-27 ENCOUNTER — Other Ambulatory Visit: Payer: Self-pay

## 2018-08-27 ENCOUNTER — Telehealth: Payer: Self-pay

## 2018-08-27 DIAGNOSIS — I10 Essential (primary) hypertension: Secondary | ICD-10-CM

## 2018-08-27 MED ORDER — ENALAPRIL MALEATE 1 MG/ML PO SOLN: 20.0000 mg | Freq: Every day | ORAL | 1 refills | Status: DC

## 2018-08-27 NOTE — Telephone Encounter (Signed)
Contacted pt wife and pt regarding if they would willing to try the liquid  Epaned ( ace inhibitor), pt stated that he would be okay to try it. [facility] she state that she would contact me later today with a response.

## 2018-09-03 ENCOUNTER — Telehealth: Payer: Self-pay | Admitting: Cardiology

## 2018-09-03 NOTE — Telephone Encounter (Signed)
We have been unable to get approval for Diovan that he tolerates well from insurance. We did try liquid Enalapril; however, cost is an issue and he is not able to afford this. I am concerned about his cardiac status without medications and uncontrolled hypertension. Unsure of other available options for patients that are intolerant to dairy. He has not formerly been evaluated for this, and I do feel would be worthwhile to ensure his symptoms are coming from this. Would also appreciate recommendations on medication options for him if he is dairy intolerant. Will place referral. Patient verbalized understanding and is agreeance to this. I have asked him to at least try smaller dose of Coreg again for BP control and he is willing to try.

## 2018-09-21 ENCOUNTER — Other Ambulatory Visit: Payer: Self-pay

## 2018-09-21 ENCOUNTER — Encounter: Payer: Self-pay | Admitting: Cardiology

## 2018-09-21 ENCOUNTER — Ambulatory Visit (INDEPENDENT_AMBULATORY_CARE_PROVIDER_SITE_OTHER): Payer: 59 | Admitting: Cardiology

## 2018-09-21 VITALS — BP 121/78 | HR 60 | Ht 70.0 in | Wt 190.0 lb

## 2018-09-21 DIAGNOSIS — Z789 Other specified health status: Secondary | ICD-10-CM | POA: Diagnosis not present

## 2018-09-21 DIAGNOSIS — I2510 Atherosclerotic heart disease of native coronary artery without angina pectoris: Secondary | ICD-10-CM

## 2018-09-21 DIAGNOSIS — I10 Essential (primary) hypertension: Secondary | ICD-10-CM | POA: Diagnosis not present

## 2018-09-21 DIAGNOSIS — E7800 Pure hypercholesterolemia, unspecified: Secondary | ICD-10-CM | POA: Diagnosis not present

## 2018-09-21 NOTE — Progress Notes (Signed)
Primary Physician:  Marda Stalker, PA-C   Patient ID: Daniel Walter, male    DOB: 1955/06/25, 63 y.o.   MRN: [id number]  Subjective:    Chief Complaint  Patient presents with  . Hypertension  . Hyperlipidemia  . Coronary Artery Disease   This visit type was conducted due to national recommendations for restrictions regarding the COVID-19 Pandemic (e.g. social distancing).  This format is felt to be most appropriate for this patient at this time.  All issues noted in this document were discussed and addressed.  No physical exam was performed (except for noted visual exam findings with Telehealth visits).  The patient has consented to conduct a Telehealth visit and understands insurance will be billed.   I discussed the limitations of evaluation and management by telemedicine and the availability of in person appointments. The patient expressed understanding and agreed to proceed.  Virtual Visit via Video Note is as below  I connected with@, on 09/29/18 at 0910 by a video enabled telemedicine application and verified that I am speaking with the correct person using two identifiers.     I have discussed with her regarding the safety during COVID Pandemic and steps and precautions including social distancing with the patient.    HPI: Daniel Walter  is a 63 y.o. male  with past medical history of CAD, diet controlled diabetes, hypertension, hyperlipidemia, GERD, and erectile dysfunction. He underwent coronary angiogram on 10/02/2015 that revealed 99% stenosis of the ostial to proximal LAD and underwent successful PTCA and stenting with a Xience Alpine 3.5 x 23 mm DES. Has history of ischemic cardiomyopathy with LVEF of 25% that resolved by echocardiogram in Sept 2017.  Patient was last seen 3 months ago for hypertension.  Patient has had issues with obtaining Diovan which he tolerates as this does not contain dairy.  He does not tolerate valsartan.  Unfortunately insurance continues to  deny his Diovan.  We had also tried liquid enalapril, but was unable to start this due to cost.  He has also stopped carvedilol as he felt that this was also contributing to his symptoms.  He states he has allergies to dairy.  He was seen in the ER on 06/06/2018 after he developed chest pressure and anxiety. Troponin was negative. He later found out about the dairy in his valsartan and since stopping the medication, he has not had any further symptoms. He has been monitoring his BP at home and for the last few days has been in the 119'E systolic.He is able to tolerate Diovan as it does not contain dairy; however, insurance has continued to deny this and he is now paying out of pocket. He is able to tolerate Aldactone. He recently had congestion, fatigue,diarrhea with Coreg that resolved with stopping; however, as his blood pressure had continued to be elevated he was willing to go back on low dose and he is tolerating this well. He is to see an Allergist in the next few days for evaluation of his dairy allergy.  He has had myalgia symptoms to several statins in the past.  Ideally he is a good candidate for PCSK9 inhibitor; however, cost has been an issue.  Recent lipids in March 2020 showed LDL increased to 94.   He has not been checking blood pressure at home. Tolerating medications well. No complaints today. No chest pain or shortness of breath.   Past Medical History:  Diagnosis Date  . CAD (coronary artery disease)   . Coronary artery  disease   . ED (erectile dysfunction)   . GERD (gastroesophageal reflux disease)   . Hypertension   . MI (myocardial infarction) (HCC)   . Myalgia due to statin     Past Surgical History:  Procedure Laterality Date  . APPENDECTOMY    . CARDIAC CATHETERIZATION N/A 10/02/2015   Procedure: Left Heart Cath and Coronary Angiography;  Surgeon: Yates DecampJay Ganji, MD;  [facility];  Service: Cardiovascular;  Laterality: N/A;  . CARDIAC CATHETERIZATION N/A  10/02/2015   Procedure: Coronary Stent Intervention;  Surgeon: Yates DecampJay Ganji, MD;  [facility];  Service: Cardiovascular;  Laterality: N/A;  Prox LAD  . CHOLECYSTECTOMY    . CORONARY ANGIOPLASTY    . KNEE ARTHROSCOPY      Social History   Socioeconomic History  . Marital status: Married    Spouse name: Not on file  . Number of children: 3  . Years of education: Not on file  . Highest education level: Not on file  Occupational History  . Not on file  Social Needs  . Financial resource strain: Not on file  . Food insecurity    Worry: Not on file    Inability: Not on file  . Transportation needs    Medical: Not on file    Non-medical: Not on file  Tobacco Use  . Smoking status: Never Smoker  . Smokeless tobacco: Never Used  Substance and Sexual Activity  . Alcohol use: Not Currently  . Drug use: No  . Sexual activity: Not on file  Lifestyle  . Physical activity    Days per week: Not on file    Minutes per session: Not on file  . Stress: Not on file  Relationships  . Social Musicianconnections    Talks on phone: Not on file    Gets together: Not on file    Attends religious service: Not on file    Active member of club or organization: Not on file    Attends meetings of clubs or organizations: Not on file    Relationship status: Not on file  . Intimate partner violence    Fear of current or ex partner: Not on file    Emotionally abused: Not on file    Physically abused: Not on file    Forced sexual activity: Not on file  Other Topics Concern  . Not on file  Social History Narrative  . Not on file    Review of Systems  Constitution: Positive for malaise/fatigue. Negative for decreased appetite, weight gain and weight loss.  Eyes: Negative for visual disturbance.  Cardiovascular: Negative for chest pain, claudication, dyspnea on exertion, leg swelling, orthopnea, palpitations and syncope.  Respiratory: Negative for hemoptysis and wheezing.   Endocrine:  Negative for cold intolerance and heat intolerance.  Hematologic/Lymphatic: Does not bruise/bleed easily.  Skin: Negative for nail changes.  Musculoskeletal: Negative for muscle weakness and myalgias.  Gastrointestinal: Positive for change in bowel habit. Negative for abdominal pain, nausea and vomiting.  Neurological: Negative for difficulty with concentration, dizziness, focal weakness and headaches.  Psychiatric/Behavioral: Negative for altered mental status and suicidal ideas.  Allergic/Immunologic: Positive for environmental allergies.  All other systems reviewed and are negative.     Objective:  Blood pressure 121/78, pulse 60, height 5\' 10"  (1.778 m), weight 190 lb (86.2 kg). Body mass index is 27.26 kg/m.    Physical Exam  Constitutional: He is oriented to person, place, and time. He appears well-developed and well-nourished. No  distress.  HENT:  Head: Normocephalic.  Neck: No thyromegaly present.  Pulmonary/Chest: Effort normal. No respiratory distress.  Neurological: He is alert and oriented to person, place, and time.  Psychiatric: He has a normal mood and affect. His behavior is normal.  Vitals reviewed.  Radiology: No results found.  Laboratory examination:    CMP Latest Ref Rng & Units 06/06/2018 05/28/2018 10/03/2015  Glucose 70 - 99 mg/dL 939(Q) 300(P) 233(A)  BUN 6 - 20 mg/dL 17 14 9   Creatinine 0.61 - 1.24 mg/dL 0.76 2.26 3.33  Sodium 135 - 145 mmol/L 134(L) 140 134(L)  Potassium 3.5 - 5.1 mmol/L 3.5 5.1 4.2  Chloride 98 - 111 mmol/L 104 100 103  CO2 22 - 32 mmol/L 24 22 23   Calcium 8.9 - 10.3 mg/dL 8.9 54.5 8.9  Total Protein 6.5 - 8.1 g/dL 6.9 6.9 -  Total Bilirubin 0.3 - 1.2 mg/dL 0.6 0.7 -  Alkaline Phos 38 - 126 U/L 64 80 -  AST 15 - 41 U/L 17 14 -  ALT 0 - 44 U/L 19 13 -   CBC Latest Ref Rng & Units 06/06/2018 05/28/2018 10/03/2015  WBC 4.0 - 10.5 K/uL 9.0 7.8 13.2(H)  Hemoglobin 13.0 - 17.0 g/dL 62.5 63.8 17.3(H)  Hematocrit 39.0 - 52.0 % 43.8 45.9 48.4   Platelets 150 - 400 K/uL 275 309 292   Lipid Panel     Component Value Date/Time   CHOL 156 05/28/2018 0919   TRIG 123 05/28/2018 0919   HDL 37 (L) 05/28/2018 0919   CHOLHDL 3.4 10/02/2015 1834   VLDL 9 10/02/2015 1834   LDLCALC 94 05/28/2018 0919   HEMOGLOBIN A1C No results found for: HGBA1C, MPG TSH No results for input(s): TSH in the last 8760 hours.  PRN Meds:. Medications Discontinued During This Encounter  Medication Reason  . enalapril (EPANED) 1 MG/ML oral solution    Current Meds  Medication Sig  . aspirin 81 MG chewable tablet Chew 1 tablet (81 mg total) by mouth daily.  . carvedilol (COREG) 12.5 MG tablet Take 1 tablet (12.5 mg total) by mouth 2 (two) times daily.  . Cholecalciferol (VITAMIN D3) 5000 units CAPS Take 1 capsule by mouth daily.  Marland Kitchen omeprazole (PRILOSEC) 40 MG capsule Take 40 mg by mouth daily.  . sildenafil (REVATIO) 20 MG tablet Take 1 tablet (20 mg total) by mouth as needed.  . [DISCONTINUED] spironolactone (ALDACTONE) 25 MG tablet TAKE 1 TABLET ONCE A DAY  . [DISCONTINUED] valsartan (DIOVAN) 80 MG tablet Take 80 mg by mouth daily.    Cardiac Studies:   Echocardiogram 11/07/2015: Left ventricle cavity is normal in size. Mild concentric hypertrophy of the left ventricle. Normal diastolic filling pattern. Left ventricle regional wall motion findings: Apical anterior, Apical septal and Apical mild hypokinesis. Visual EF is 50-55%. Trace mitral regurgitation. Mild tricuspid regurgitation. No evidence of pulmonary hypertension. Compared to the echocardiogram 10/03/2015, EF improved from 30-35 percent.  Coronary angiogram 10/02/2015: 1. Ost LAD to Prox LAD lesion, 99 %stenosed. XIENCE ALPINE RX [id number] drug eluting stent. Post intervention, there is a 0% residual stenosis. TIMI flow improved from TIMI 1 to TIMI 3 at end of the procedure. Otherwise normal coronary arteries. 2. There is severe left ventricular systolic dysfunction. The left ventricular  ejection fraction is less than 25% by visual estimate.  Assessment:     ICD-10-CM   1. Coronary artery disease involving native coronary artery of native heart without angina pectoris  I25.10   2. Essential hypertension  I10   3. Hypercholesteremia  E78.00   4. Statin intolerance  Z78.9     EKG 03/02/2018: Sinus rhythm at a rate of 79 bpm, normal axis, normal intervals, no evidence of ischemia. No significant change from EKG 03/11/2017.   Recommendations:   Patient is overall doing well. No symptoms of angina. Blood pressure has remained well controlled with diovan, aldactone, and coreg. He is to establish with allergist this week for evaluation of his dairy intolerance/allergy for conformation of this. Hopefully, if this is confirmed we will be able to get insurance to approve his Diovan.   Lipids had increased at his last check and continue to not be at goal. He is had severe myalgia symptoms with statins in the past. He is not interested in PCSK9 inhibitors. I have asked him to research Nexlitol to see if he feels that he would be willing to try this. He prefers to research any medication prior to starting. He will let us know if he is willing to start this. I will repeat lipids prior to next office visit. Encourage him to continue with diet changes to help with controlling his lipids. I will see him back in 3 months for follow up.   Toniann FailAshton Haynes Brailynn Breth, MSN, APRN, FNP-C [facility] Office: [phone] Fax: [phone]

## 2018-09-23 ENCOUNTER — Other Ambulatory Visit: Payer: Self-pay | Admitting: Cardiology

## 2018-09-23 DIAGNOSIS — I10 Essential (primary) hypertension: Secondary | ICD-10-CM

## 2018-09-29 ENCOUNTER — Encounter: Payer: Self-pay | Admitting: Cardiology

## 2018-10-02 ENCOUNTER — Telehealth: Payer: Self-pay

## 2018-10-02 NOTE — Telephone Encounter (Signed)
Pt's wife called about getting the liquid enalapril now, as they have met there deductible. Med would need to be pre approved also. I am out of office Monday but you can send to [facility] since she is knowledgeable about this case.

## 2018-10-05 ENCOUNTER — Other Ambulatory Visit: Payer: Self-pay

## 2018-10-05 DIAGNOSIS — I10 Essential (primary) hypertension: Secondary | ICD-10-CM

## 2018-10-05 MED ORDER — ENALAPRIL MALEATE 1 MG/ML PO SOLN: 20.0000 mg | Freq: Every day | ORAL | 1 refills | Status: DC

## 2018-10-05 NOTE — Telephone Encounter (Signed)
yes

## 2018-10-05 NOTE — Telephone Encounter (Signed)
Can you work on this?

## 2018-10-16 ENCOUNTER — Telehealth: Payer: Self-pay

## 2018-10-16 NOTE — Telephone Encounter (Signed)
Contacted pt regarding changing medication to Telmisartan 40mg  daily, pharmacy doesn't carry previous medication. LVM for patient to respond.

## 2018-10-19 ENCOUNTER — Other Ambulatory Visit: Payer: Self-pay

## 2018-10-19 DIAGNOSIS — I10 Essential (primary) hypertension: Secondary | ICD-10-CM

## 2018-10-19 MED ORDER — TELMISARTAN 40 MG PO TABS: 40.0000 mg | ORAL_TABLET | Freq: Every day | ORAL | 1 refills | Status: DC

## 2018-10-27 ENCOUNTER — Other Ambulatory Visit: Payer: Self-pay | Admitting: Cardiology

## 2018-10-27 DIAGNOSIS — I10 Essential (primary) hypertension: Secondary | ICD-10-CM

## 2018-12-11 ENCOUNTER — Other Ambulatory Visit: Payer: Self-pay | Admitting: Cardiology

## 2018-12-11 DIAGNOSIS — I10 Essential (primary) hypertension: Secondary | ICD-10-CM

## 2018-12-21 ENCOUNTER — Other Ambulatory Visit: Payer: Self-pay

## 2018-12-21 ENCOUNTER — Ambulatory Visit: Payer: 59 | Admitting: Cardiology

## 2018-12-21 ENCOUNTER — Encounter: Payer: Self-pay | Admitting: Cardiology

## 2018-12-21 VITALS — BP 128/89 | HR 71 | Temp 97.1°F | Ht 70.0 in | Wt 183.0 lb

## 2018-12-21 DIAGNOSIS — Z789 Other specified health status: Secondary | ICD-10-CM | POA: Diagnosis not present

## 2018-12-21 DIAGNOSIS — I10 Essential (primary) hypertension: Secondary | ICD-10-CM

## 2018-12-21 DIAGNOSIS — I2510 Atherosclerotic heart disease of native coronary artery without angina pectoris: Secondary | ICD-10-CM

## 2018-12-21 DIAGNOSIS — E7800 Pure hypercholesterolemia, unspecified: Secondary | ICD-10-CM

## 2018-12-21 NOTE — Progress Notes (Signed)
Primary Physician:  Jarrett Soho, PA-C   Patient ID: Daniel Walter, male    DOB: 11-05-1955, 63 y.o.   MRN: [id number]  Subjective:    Chief Complaint  Patient presents with  . Hypertension  . Hyperlipidemia  . Coronary Artery Disease  . Follow-up    3 month    HPI: Daniel Walter  is a 63 y.o. male  with past medical history of CAD, diet controlled diabetes, hypertension, hyperlipidemia, GERD, and erectile dysfunction. He underwent coronary angiogram on 10/02/2015 that revealed 99% stenosis of the ostial to proximal LAD and underwent successful PTCA and stenting with a Xience Alpine 3.5 x 23 mm DES. Has history of ischemic cardiomyopathy with LVEF of 25% that resolved by echocardiogram in Sept 2017.  Patient was last seen 3 months ago for hypertension.  Patient has had issues with obtaining Diovan which he tolerates as this does not contain dairy.  He does not tolerate valsartan.  Unfortunately insurance continues to deny his Diovan.  We had also tried liquid enalapril, but was unable to start this due to cost.  He is now on Telmisartan and tolerating this well.   He was evaluated by allergist, but was not found to have dairy allergy but potential intolerance. He has started on prilosec for potential GERD, states that this has significantly improved his symptoms of congestion and anxious feeling in his chest. He has also made changes to his diet.  He has had myalgia symptoms to several statins in the past.  Ideally he is a good candidate for PCSK9 inhibitor; however, cost has been an issue.  Recent lipids in March 2020 showed LDL increased to 94.   Tolerating medications well. No complaints today. No chest pain or shortness of breath.   Past Medical History:  Diagnosis Date  . CAD (coronary artery disease)   . Coronary artery disease   . ED (erectile dysfunction)   . GERD (gastroesophageal reflux disease)   . Hypertension   . MI (myocardial infarction) (HCC)   . Myalgia  due to statin     Past Surgical History:  Procedure Laterality Date  . APPENDECTOMY    . CARDIAC CATHETERIZATION N/A 10/02/2015   Procedure: Left Heart Cath and Coronary Angiography;  Surgeon: Yates Decamp, MD;  Location: [facility] INVASIVE CV LAB;  Service: Cardiovascular;  Laterality: N/A;  . CARDIAC CATHETERIZATION N/A 10/02/2015   Procedure: Coronary Stent Intervention;  Surgeon: Yates Decamp, MD;  Location: [facility] INVASIVE CV LAB;  Service: Cardiovascular;  Laterality: N/A;  Prox LAD  . CHOLECYSTECTOMY    . CORONARY ANGIOPLASTY    . KNEE ARTHROSCOPY      Social History   Socioeconomic History  . Marital status: Married    Spouse name: Not on file  . Number of children: 3  . Years of education: Not on file  . Highest education level: Not on file  Occupational History  . Not on file  Social Needs  . Financial resource strain: Not on file  . Food insecurity    Worry: Not on file    Inability: Not on file  . Transportation needs    Medical: Not on file    Non-medical: Not on file  Tobacco Use  . Smoking status: Never Smoker  . Smokeless tobacco: Never Used  Substance and Sexual Activity  . Alcohol use: Not Currently  . Drug use: No  . Sexual activity: Not on file  Lifestyle  . Physical activity    Days per  week: Not on file    Minutes per session: Not on file  . Stress: Not on file  Relationships  . Social Herbalist on phone: Not on file    Gets together: Not on file    Attends religious service: Not on file    Active member of club or organization: Not on file    Attends meetings of clubs or organizations: Not on file    Relationship status: Not on file  . Intimate partner violence    Fear of current or ex partner: Not on file    Emotionally abused: Not on file    Physically abused: Not on file    Forced sexual activity: Not on file  Other Topics Concern  . Not on file  Social History Narrative  . Not on file    Review of Systems  Constitution: Negative for  decreased appetite, malaise/fatigue, weight gain and weight loss.  Eyes: Negative for visual disturbance.  Cardiovascular: Negative for chest pain, claudication, dyspnea on exertion, leg swelling, orthopnea, palpitations and syncope.  Respiratory: Negative for hemoptysis and wheezing.   Endocrine: Negative for cold intolerance and heat intolerance.  Hematologic/Lymphatic: Does not bruise/bleed easily.  Skin: Negative for nail changes.  Musculoskeletal: Negative for muscle weakness and myalgias.  Gastrointestinal: Negative for abdominal pain, change in bowel habit, nausea and vomiting.  Neurological: Negative for difficulty with concentration, dizziness, focal weakness and headaches.  Psychiatric/Behavioral: Negative for altered mental status and suicidal ideas.  Allergic/Immunologic: Positive for environmental allergies.  All other systems reviewed and are negative.     Objective:  Blood pressure 128/89, pulse 71, temperature (!) 97.1 F (36.2 C), height 5\' 10"  (1.778 m), weight 183 lb (83 kg), SpO2 98 %. Body mass index is 26.26 kg/m.    Physical Exam  Constitutional: He is oriented to person, place, and time. Vital signs are normal. He appears well-developed and well-nourished. No distress.  HENT:  Head: Normocephalic and atraumatic.  Neck: Normal range of motion. No thyromegaly present.  Cardiovascular: Normal rate, regular rhythm, normal heart sounds and intact distal pulses.  Pulmonary/Chest: Effort normal and breath sounds normal. No accessory muscle usage. No respiratory distress.  Abdominal: Soft. Bowel sounds are normal.  Musculoskeletal: Normal range of motion.  Neurological: He is alert and oriented to person, place, and time.  Skin: Skin is warm and dry.  Psychiatric: He has a normal mood and affect. His behavior is normal.  Vitals reviewed.  Radiology: No results found.  Laboratory examination:    CMP Latest Ref Rng & Units 06/06/2018 05/28/2018 10/03/2015  Glucose 70  - 99 mg/dL 194(H) 176(H) 211(H)  BUN 6 - 20 mg/dL 17 14 9   Creatinine 0.61 - 1.24 mg/dL 0.82 0.89 0.75  Sodium 135 - 145 mmol/L 134(L) 140 134(L)  Potassium 3.5 - 5.1 mmol/L 3.5 5.1 4.2  Chloride 98 - 111 mmol/L 104 100 103  CO2 22 - 32 mmol/L 24 22 23   Calcium 8.9 - 10.3 mg/dL 8.9 10.0 8.9  Total Protein 6.5 - 8.1 g/dL 6.9 6.9 -  Total Bilirubin 0.3 - 1.2 mg/dL 0.6 0.7 -  Alkaline Phos 38 - 126 U/L 64 80 -  AST 15 - 41 U/L 17 14 -  ALT 0 - 44 U/L 19 13 -   CBC Latest Ref Rng & Units 06/06/2018 05/28/2018 10/03/2015  WBC 4.0 - 10.5 K/uL 9.0 7.8 13.2(H)  Hemoglobin 13.0 - 17.0 g/dL 15.0 15.7 17.3(H)  Hematocrit 39.0 - 52.0 %  43.8 45.9 48.4  Platelets 150 - 400 K/uL 275 309 292   Lipid Panel     Component Value Date/Time   CHOL 156 05/28/2018 0919   TRIG 123 05/28/2018 0919   HDL 37 (L) 05/28/2018 0919   CHOLHDL 3.4 10/02/2015 1834   VLDL 9 10/02/2015 1834   LDLCALC 94 05/28/2018 0919   HEMOGLOBIN A1C No results found for: HGBA1C, MPG TSH No results for input(s): TSH in the last 8760 hours.  PRN Meds:. There are no discontinued medications. Current Meds  Medication Sig  . aspirin 81 MG chewable tablet Chew 1 tablet (81 mg total) by mouth daily.  . carvedilol (COREG) 12.5 MG tablet Take 1 tablet (12.5 mg total) by mouth 2 (two) times daily.  . Cholecalciferol (VITAMIN D3) 5000 units CAPS Take 1 capsule by mouth daily.  Marland Kitchen omeprazole (PRILOSEC) 40 MG capsule Take 40 mg by mouth daily.  . sildenafil (REVATIO) 20 MG tablet Take 1 tablet (20 mg total) by mouth as needed.  Marland Kitchen telmisartan (MICARDIS) 40 MG tablet Take 1 tablet (40 mg total) by mouth daily.    Cardiac Studies:   Echocardiogram 11/07/2015: Left ventricle cavity is normal in size. Mild concentric hypertrophy of the left ventricle. Normal diastolic filling pattern. Left ventricle regional wall motion findings: Apical anterior, Apical septal and Apical mild hypokinesis. Visual EF is 50-55%. Trace mitral regurgitation.  Mild tricuspid regurgitation. No evidence of pulmonary hypertension. Compared to the echocardiogram 10/03/2015, EF improved from 30-35 percent.  Coronary angiogram 10/02/2015: 1. Ost LAD to Prox LAD lesion, 99 %stenosed. XIENCE ALPINE RX [id number] drug eluting stent. Post intervention, there is a 0% residual stenosis. TIMI flow improved from TIMI 1 to TIMI 3 at end of the procedure. Otherwise normal coronary arteries. 2. There is severe left ventricular systolic dysfunction. The left ventricular ejection fraction is less than 25% by visual estimate.  Assessment:     ICD-10-CM   1. Coronary artery disease involving native coronary artery of native heart without angina pectoris  I25.10 EKG 12-Lead  2. Essential hypertension  I10 EKG 12-Lead  3. Hypercholesteremia  E78.00   4. Statin intolerance  Z78.9     EKG 12/21/2018: Normal sinus rhythm at 73 bpm, normal axis, no evidence of ischemia.   Recommendations:   Patient is here for 2-month visit and follow-up for CAD and hypertension.  Blood pressure is well controlled with current occasions.  He is intolerant to several medications due to dairy intolerance, but has been able to tolerate telmisartan and also Coreg which he reports has some dairy.  He has found that his previous symptoms were potentially related to acid reflux he has had significant improvement in symptoms with being on Prilosec and making changes to his diet.  No changes are noted to EKG or physical exam.  He has not had any chest pain or shortness of breath.  He reports that he will be having labs with his PCP next week and I will request records to be sent to [place].  If he does not have a lipid panel performed at that time, will notify him to have this performed for follow-up.  He has tried several statins in the past and is also intolerant to these.  He prefers to research his medications.  I discussed indications for Nexletol.  He will research this and notify me if he is willing to  start.  We had tried PCSK9 inhibitors, but were unable to get insurance coverage.  I will see him  back in 6 months or sooner if problems.  Toniann FailAshton Haynes Sonny Anthes, MSN, APRN, FNP-C [facility] [place] Office: [phone] Fax: [phone]

## 2018-12-25 ENCOUNTER — Ambulatory Visit: Payer: 59 | Admitting: Cardiology

## 2019-01-08 ENCOUNTER — Other Ambulatory Visit: Payer: Self-pay | Admitting: Cardiology

## 2019-01-12 ENCOUNTER — Other Ambulatory Visit: Payer: Self-pay | Admitting: Cardiology

## 2019-01-12 DIAGNOSIS — I10 Essential (primary) hypertension: Secondary | ICD-10-CM

## 2019-01-14 NOTE — Progress Notes (Signed)
Labs 01/05/2019: Total cholesterol 154, triglycerides 134, HDL 39, LDL 89.  Non-HDL cholesterol 115.

## 2019-02-10 ENCOUNTER — Other Ambulatory Visit: Payer: Self-pay | Admitting: Cardiology

## 2019-02-18 ENCOUNTER — Other Ambulatory Visit: Payer: Self-pay | Admitting: Cardiology

## 2019-02-18 DIAGNOSIS — I10 Essential (primary) hypertension: Secondary | ICD-10-CM

## 2019-03-16 ENCOUNTER — Other Ambulatory Visit: Payer: Self-pay | Admitting: Cardiology

## 2019-03-16 DIAGNOSIS — I10 Essential (primary) hypertension: Secondary | ICD-10-CM

## 2019-04-12 ENCOUNTER — Other Ambulatory Visit: Payer: Self-pay | Admitting: Cardiology

## 2019-04-12 DIAGNOSIS — I10 Essential (primary) hypertension: Secondary | ICD-10-CM

## 2019-04-14 NOTE — Telephone Encounter (Signed)
refill 

## 2019-05-10 ENCOUNTER — Other Ambulatory Visit: Payer: Self-pay | Admitting: Cardiology

## 2019-05-10 DIAGNOSIS — I10 Essential (primary) hypertension: Secondary | ICD-10-CM

## 2019-06-20 NOTE — Progress Notes (Signed)
Primary Physician:  Marda Stalker, PA-C   Patient ID: [name], male    DOB: [birth year], 64 y.o.   MRN: [id number]  Subjective:    Chief Complaint  Patient presents with  . Coronary Artery Disease  . Hypertension  . Follow-up    6 month    HPI: Daniel Walter  is a 64 y.o. male  with past medical history of CAD S/P  stenting with a Xience Alpine 3.5 x 23 mm DES. In 2013, diet controlled diabetes, hypertension, hyperlipidemia, GERD, and erectile dysfunction, severe allergy to milk and mild products and now on Telmisartan and tolerating this well, could not tolerate generic Diovan. He was evaluated by allergist, but was not found to have dairy allergy but potential intolerance.  He has now started to have difficulty with taking carvedilol with abdominal discomfort and wishes to change the medication but would not want to change it without switching to something else as he is aware that he needs a medication from cardiac standpoint.  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  No chest pain, no dyspnea.  He has had myalgia symptoms to several statins in the past.  Ideally he is a good candidate for PCSK9 inhibitor; however, cost has been an issue.  Recent lipids in March 2020 showed LDL increased to 94.    Past Medical History:  Diagnosis Date  . CAD (coronary artery disease)   . Coronary artery disease   . ED (erectile dysfunction)   . GERD (gastroesophageal reflux disease)   . Hypertension   . MI (myocardial infarction) ([place])   . Myalgia due to statin     Past Surgical History:  Procedure Laterality Date  . APPENDECTOMY    . CARDIAC CATHETERIZATION N/A 10/02/2015   Procedure: Left Heart Cath and Coronary Angiography;  Surgeon: Adrian Prows, MD;  Location: [facility] CV LAB;  Service: Cardiovascular;  Laterality: N/A;  . CARDIAC CATHETERIZATION N/A 10/02/2015   Procedure: Coronary Stent Intervention;  Surgeon:  Adrian Prows, MD;  Location: [place] CV LAB;  Service: Cardiovascular;  Laterality: N/A;  Prox LAD  . CHOLECYSTECTOMY    . CORONARY ANGIOPLASTY    . KNEE ARTHROSCOPY     Social History   Tobacco Use  . Smoking status: Never Smoker  . Smokeless tobacco: Never Used  Substance Use Topics  . Alcohol use: Not Currently   Marital Status: Married   Review of Systems  Cardiovascular: Negative for chest pain, dyspnea on exertion and leg swelling.  Musculoskeletal: Positive for arthritis (right shoulder), back pain, muscle cramps and neck pain.  Gastrointestinal: Positive for bloating. Negative for melena.   Objective:  Blood pressure (!) 122/91, pulse 78, temperature 98 F (36.7 C), height '5\' 10"'  (1.778 m), weight 191 lb (86.6 kg), SpO2 100 %. Body mass index is 27.41 kg/m.  Vitals with BMI 06/21/2019 12/21/2018 09/21/2018  Height '5\' 10"'  '5\' 10"'  '5\' 10"'   Weight 191 lbs 183 lbs 190 lbs  BMI 27.41 14.97 02.63  Systolic 785 885 027  Diastolic 91 89 78  Pulse 78 71 60      Physical Exam  Constitutional: Vital signs are normal. He appears well-developed and well-nourished. No distress.  Cardiovascular: Normal rate, regular rhythm, normal heart sounds and intact distal pulses.  No JVD. No pedal edema  Pulmonary/Chest: Effort normal and breath sounds normal. No accessory muscle usage. No respiratory distress.  Abdominal: Soft. Bowel sounds  are normal.  Vitals reviewed.  Radiology: No results found.  Laboratory examination:    CMP Latest Ref Rng & Units 06/06/2018 05/28/2018 10/03/2015  Glucose 70 - 99 mg/dL 194(H) 176(H) 211(H)  BUN 6 - 20 mg/dL '17 14 9  ' Creatinine 0.61 - 1.24 mg/dL 0.82 0.89 0.75  Sodium 135 - 145 mmol/L 134(L) 140 134(L)  Potassium 3.5 - 5.1 mmol/L 3.5 5.1 4.2  Chloride 98 - 111 mmol/L 104 100 103  CO2 22 - 32 mmol/L '24 22 23  ' Calcium 8.9 - 10.3 mg/dL 8.9 10.0 8.9  Total Protein 6.5 - 8.1 g/dL 6.9 6.9 -  Total Bilirubin 0.3 - 1.2 mg/dL 0.6 0.7 -  Alkaline Phos 38 -  126 U/L 64 80 -  AST 15 - 41 U/L 17 14 -  ALT 0 - 44 U/L 19 13 -   CBC Latest Ref Rng & Units 06/06/2018 05/28/2018 10/03/2015  WBC 4.0 - 10.5 K/uL 9.0 7.8 13.2(H)  Hemoglobin 13.0 - 17.0 g/dL 15.0 15.7 17.3(H)  Hematocrit 39.0 - 52.0 % 43.8 45.9 48.4  Platelets 150 - 400 K/uL 275 309 292   Lipid Panel     Component Value Date/Time   CHOL 156 05/28/2018 0919   TRIG 123 05/28/2018 0919   HDL 37 (L) 05/28/2018 0919   CHOLHDL 3.4 10/02/2015 1834   VLDL 9 10/02/2015 1834   LDLCALC 94 05/28/2018 0919   HEMOGLOBIN A1C No results found for: HGBA1C, MPG TSH No results for input(s): TSH in the last 8760 hours.  External labs:  Labs 01/05/2019: Total cholesterol 154, triglycerides 134, HDL 39, LDL 89.  Non-HDL cholesterol 115.  PRN Meds:. Medications Discontinued During This Encounter  Medication Reason  . metFORMIN (GLUCOPHAGE) 500 MG tablet Error  . omeprazole (PRILOSEC) 40 MG capsule Error  . telmisartan (MICARDIS) 40 MG tablet Reorder   Current Meds  Medication Sig  . aspirin 81 MG chewable tablet Chew 1 tablet (81 mg total) by mouth daily.  . carvedilol (COREG) 6.25 MG tablet Take 6.25 mg by mouth daily.  . Cholecalciferol (VITAMIN D3) 5000 units CAPS Take 1 capsule by mouth daily.  . NON FORMULARY CBD OIL  . sildenafil (REVATIO) 20 MG tablet Take 1 tablet (20 mg total) by mouth as needed.  Marland Kitchen spironolactone (ALDACTONE) 25 MG tablet TAKE 1 TABLET ONCE A DAY  . telmisartan (MICARDIS) 80 MG tablet Take 1 tablet (80 mg total) by mouth daily.  . [DISCONTINUED] telmisartan (MICARDIS) 40 MG tablet TAKE 1 TABLET BY MOUTH DAILY.    Cardiac Studies:   Echocardiogram 11/07/2015: Left ventricle cavity is normal in size. Mild concentric hypertrophy of the left ventricle. Normal diastolic filling pattern. Left ventricle regional wall motion findings: Apical anterior, Apical septal and Apical mild hypokinesis. Visual EF is 50-55%. Trace mitral regurgitation. Mild tricuspid regurgitation.  No evidence of pulmonary hypertension. Compared to the echocardiogram 10/03/2015, EF improved from 30-35 percent.  Coronary angiogram 10/02/2015: 1. Ost LAD to Prox LAD lesion, 99 %stenosed. XIENCE ALPINE RX [id number] drug eluting stent. Post intervention, there is a 0% residual stenosis. TIMI flow improved from TIMI 1 to TIMI 3 at end of the procedure. Otherwise normal coronary arteries. 2. There is severe left ventricular systolic dysfunction. The left ventricular ejection fraction is less than 25% by visual estimate.  EKG:   EKG 06/21/2019: Normal sinus rhythm with rate of 73 bpm, normal axis.  Poor R wave progression, cannot exclude anteroseptal infarct old.  Normal QT interval.  No evidence of ischemia.  Lov voltage.  No significant change from EKG 12/21/2018.  Assessment:     ICD-10-CM   1. Coronary artery disease involving native coronary artery of native heart without angina pectoris  I25.10 EKG 12-Lead  2. Essential hypertension  I10 CMP14+EGFR  3. Hypercholesteremia  E78.00 Lipid Panel With LDL/HDL Ratio  4. Statin intolerance  Z78.9    Recommendations:   Daniel Walter  is a 64 y.o.  male  with past medical history of CAD S/P  stenting with a Xience Alpine 3.5 x 23 mm DES. In 2013, diet controlled diabetes, hypertension, hyperlipidemia, GERD, and erectile dysfunction, now on Telmisartan and tolerating this well, could not tolerate generic Diovan.  He is now having difficulty with taking carvedilol but did not want to change the medication as he needs it for cardiac standpoint, I have given him a list of beta-blockers that he can check with pharmacy to see if they have any milk products.  He has multiple medication allergies, LDL Is not at goal.  Could ty Nexlitol or Nexlizet.  But I am concerned about his medication intolerances.  Ideally I would like to discuss with him regarding PCSK9 inhibitors which I will do so on his next office visit as it was a very long visit with requesting and  again telling me the whole allergy history that took quite a while in spite of me directing his attention towards his present cardiac issues.  His blood pressure is uncontrolled, I have increased his telmisartan to 80 mg daily, he is on spironolactone which he is tolerating, will obtain CMP along with lipid profile testing in 2 weeks.  I had like to see him back in 3 months.  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  He has not had any angina, EKG is normal.  Adrian Prows, MD, [facility] 06/21/2019, 9:34 AM [facility] [place] Office: [phone]

## 2019-06-21 ENCOUNTER — Encounter: Payer: Self-pay | Admitting: Cardiology

## 2019-06-21 ENCOUNTER — Ambulatory Visit: Payer: 59 | Admitting: Cardiology

## 2019-06-21 ENCOUNTER — Other Ambulatory Visit: Payer: Self-pay

## 2019-06-21 VITALS — BP 122/91 | HR 78 | Temp 98.0°F | Ht 70.0 in | Wt 191.0 lb

## 2019-06-21 DIAGNOSIS — Z789 Other specified health status: Secondary | ICD-10-CM

## 2019-06-21 DIAGNOSIS — I10 Essential (primary) hypertension: Secondary | ICD-10-CM

## 2019-06-21 DIAGNOSIS — E7800 Pure hypercholesterolemia, unspecified: Secondary | ICD-10-CM

## 2019-06-21 DIAGNOSIS — I2510 Atherosclerotic heart disease of native coronary artery without angina pectoris: Secondary | ICD-10-CM

## 2019-06-21 MED ORDER — TELMISARTAN 80 MG PO TABS: 80.0000 mg | ORAL_TABLET | Freq: Every day | ORAL | 3 refills | Status: DC

## 2019-06-23 ENCOUNTER — Other Ambulatory Visit: Payer: Self-pay

## 2019-06-23 MED ORDER — CARVEDILOL 12.5 MG PO TABS: 12.5000 mg | ORAL_TABLET | Freq: Two times a day (BID) | ORAL | 1 refills | Status: DC

## 2019-07-07 LAB — LIPID PANEL WITH LDL/HDL RATIO
Cholesterol, Total: 151 mg/dL (ref 100–199)
HDL: 41 mg/dL (ref >39)
LDL Chol Calc (NIH): 81 mg/dL (ref 0–99)
LDL/HDL Ratio: 2 ratio (ref 0.0–3.6)
Triglycerides: 171 mg/dL — ABNORMAL HIGH (ref 0–149)
VLDL Cholesterol Cal: 29 mg/dL (ref 5–40)

## 2019-07-07 LAB — CMP14+EGFR
ALT: 12 IU/L (ref 0–44)
AST: 13 IU/L (ref 0–40)
Albumin/Globulin Ratio: 2 (ref 1.2–2.2)
Albumin: 4.5 g/dL (ref 3.8–4.8)
Alkaline Phosphatase: 79 IU/L (ref 39–117)
BUN/Creatinine Ratio: 13 (ref 10–24)
BUN: 11 mg/dL (ref 8–27)
Bilirubin Total: 0.7 mg/dL (ref 0.0–1.2)
CO2: 26 mmol/L (ref 20–29)
Calcium: 9.8 mg/dL (ref 8.6–10.2)
Chloride: 104 mmol/L (ref 96–106)
Creatinine, Ser: 0.86 mg/dL (ref 0.76–1.27)
GFR calc Af Amer: 107 mL/min/{1.73_m2} (ref >59)
GFR calc non Af Amer: 93 mL/min/{1.73_m2} (ref >59)
Globulin, Total: 2.2 g/dL (ref 1.5–4.5)
Glucose: 116 mg/dL — ABNORMAL HIGH (ref 65–99)
Potassium: 4.9 mmol/L (ref 3.5–5.2)
Sodium: 141 mmol/L (ref 134–144)
Total Protein: 6.7 g/dL (ref 6.0–8.5)

## 2019-07-07 NOTE — Progress Notes (Signed)
Target LDL is <70 in view of CAD. Triglycerides and blood sugar elevated from diabetes. May benefit from Vascepa. Otherwise normal labs

## 2019-07-28 ENCOUNTER — Other Ambulatory Visit: Payer: Self-pay | Admitting: Cardiology

## 2019-07-28 DIAGNOSIS — I10 Essential (primary) hypertension: Secondary | ICD-10-CM

## 2019-09-30 ENCOUNTER — Other Ambulatory Visit: Payer: Self-pay | Admitting: Cardiology

## 2019-09-30 ENCOUNTER — Other Ambulatory Visit: Payer: Self-pay

## 2019-09-30 ENCOUNTER — Encounter: Payer: Self-pay | Admitting: Cardiology

## 2019-09-30 ENCOUNTER — Ambulatory Visit: Payer: BC Managed Care – PPO | Admitting: Cardiology

## 2019-09-30 VITALS — BP 128/76 | HR 67 | Resp 17 | Ht 70.0 in | Wt 195.0 lb

## 2019-09-30 DIAGNOSIS — Z91011 Allergy to milk products: Secondary | ICD-10-CM

## 2019-09-30 DIAGNOSIS — E7800 Pure hypercholesterolemia, unspecified: Secondary | ICD-10-CM | POA: Diagnosis not present

## 2019-09-30 DIAGNOSIS — I2510 Atherosclerotic heart disease of native coronary artery without angina pectoris: Secondary | ICD-10-CM

## 2019-09-30 DIAGNOSIS — I10 Essential (primary) hypertension: Secondary | ICD-10-CM

## 2019-09-30 MED ORDER — RED YEAST RICE 600 MG PO TABS: 2.0000 | ORAL_TABLET | Freq: Every day | ORAL | Status: DC

## 2019-09-30 NOTE — Progress Notes (Signed)
Primary Physician:  Jarrett Soho, PA-C   Patient ID: Daniel Walter, male    DOB: 1955-12-02, 64 y.o.   MRN: [id number]  Subjective:    Chief Complaint  Patient presents with  . Coronary Artery Disease  . Hypertension  . Follow-up    3 month    HPI: Daniel Walter  is a 64 y.o. male  with past medical history of CAD S/P  stenting with a Xience Alpine 3.5 x 23 mm DES. In 2013, diet controlled diabetes, hypertension, hyperlipidemia, GERD, and erectile dysfunction, severe allergy to milk and mild products and now on Telmisartan and tolerating this well, could not tolerate generic Diovan. He was evaluated by allergist, but was not found to have dairy allergy but potential intolerance.   He has had myalgia symptoms to several statins in the past.  Ideally he is a good candidate for PCSK9 inhibitor; however, cost has been an issue.  Recent lipids in March 2020 showed LDL increased to 94.  I will increase the dose of telmisartan on his last office visit due to elevated blood pressure.  He now presents for follow-up, states that his blood pressure has been under excellent control.  Presently not having any chest pain, dyspnea states that he feels well.    Past Medical History:  Diagnosis Date  . CAD (coronary artery disease)   . Coronary artery disease   . ED (erectile dysfunction)   . GERD (gastroesophageal reflux disease)   . Hypertension   . MI (myocardial infarction) (HCC)   . Myalgia due to statin     Past Surgical History:  Procedure Laterality Date  . APPENDECTOMY    . CARDIAC CATHETERIZATION N/A 10/02/2015   Procedure: Left Heart Cath and Coronary Angiography;  Surgeon: Yates Decamp, MD;  Location: [facility] INVASIVE CV LAB;  Service: Cardiovascular;  Laterality: N/A;  . CARDIAC CATHETERIZATION N/A 10/02/2015   Procedure: Coronary Stent Intervention;  Surgeon: Yates Decamp, MD;  Location: [facility] INVASIVE CV LAB;  Service: Cardiovascular;  Laterality: N/A;  Prox LAD  . CHOLECYSTECTOMY    .  CORONARY ANGIOPLASTY    . KNEE ARTHROSCOPY     Social History   Tobacco Use  . Smoking status: Never Smoker  . Smokeless tobacco: Never Used  Substance Use Topics  . Alcohol use: Yes    Comment: occ   Marital Status: Married   Review of Systems  Cardiovascular: Negative for chest pain, dyspnea on exertion and leg swelling.  Musculoskeletal: Positive for arthritis (right shoulder), back pain, muscle cramps and neck pain.  Gastrointestinal: Positive for bloating. Negative for melena.  Psychiatric/Behavioral: The patient is nervous/anxious.    Objective:  Blood pressure 128/76, pulse 67, resp. rate 17, height 5\' 10"  (1.778 m), weight 195 lb (88.5 kg), SpO2 99 %. Body mass index is 27.98 kg/m.  Vitals with BMI 09/30/2019 06/21/2019 12/21/2018  Height 5\' 10"  5\' 10"  5\' 10"   Weight 195 lbs 191 lbs 183 lbs  BMI 27.98 27.41 26.26  Systolic 128 122 12/23/2018  Diastolic 76 91 89  Pulse 67 78 71      Physical Exam Vitals reviewed.  Constitutional:      General: He is not in acute distress.    Appearance: He is well-developed.  Cardiovascular:     Rate and Rhythm: Normal rate and regular rhythm.     Pulses: Intact distal pulses.     Heart sounds: Normal heart sounds.     Comments: No JVD. No pedal edema Pulmonary:  Effort: Pulmonary effort is normal. No accessory muscle usage or respiratory distress.     Breath sounds: Normal breath sounds.  Abdominal:     General: Bowel sounds are normal.     Palpations: Abdomen is soft.    Radiology: No results found.  Laboratory examination:    CMP Latest Ref Rng & Units 07/06/2019 06/06/2018 05/28/2018  Glucose 65 - 99 mg/dL 592(T) 244(Q) 286(N)  BUN 8 - 27 mg/dL 11 17 14   Creatinine 0.76 - 1.27 mg/dL 8.17 7.11  Sodium 134 - 144 mmol/L 141 134(L) 140  Potassium 3.5 - 5.2 mmol/L 4.9 3.5 5.1  Chloride 96 - 106 mmol/L 104 104 100  CO2 20 - 29 mmol/L 26 24 22   Calcium 8.6 - 10.2 mg/dL 9.8 8.9 6.57  Total Protein 6.0 - 8.5 g/dL 6.7 6.9  6.9  Total Bilirubin 0.0 - 1.2 mg/dL 0.7 0.6 0.7  Alkaline Phos 39 - 117 IU/L 79 64 80  AST 0 - 40 IU/L 13 17 14   ALT 0 - 44 IU/L 12 19 13    CBC Latest Ref Rng & Units 06/06/2018 05/28/2018 10/03/2015  WBC 4.0 - 10.5 K/uL 9.0 7.8 13.2(H)  Hemoglobin 13.0 - 17.0 g/dL 08/06/2018 17.3(H)  Hematocrit 39 - 52 % 43.8 45.9 48.4  Platelets 150 - 400 K/uL 275 309 292   Lipid Panel     Component Value Date/Time   CHOL 151 07/06/2019 1121   TRIG 171 (H) 07/06/2019 1121   HDL 41 07/06/2019 1121   CHOLHDL 3.4 10/02/2015 1834   VLDL 9 10/02/2015 1834   LDLCALC 81 07/06/2019 1121   HEMOGLOBIN A1C No results found for: HGBA1C, MPG TSH No results for input(s): TSH in the last 8760 hours.  External labs:  Labs 01/05/2019: Total cholesterol 154, triglycerides 134, HDL 39, LDL 89.  Non-HDL cholesterol 115.  PRN Meds:. There are no discontinued medications. Current Meds  Medication Sig  . aspirin 81 MG chewable tablet Chew 1 tablet (81 mg total) by mouth daily.  . carvedilol (COREG) 12.5 MG tablet Take 1 tablet (12.5 mg total) by mouth 2 (two) times daily.  . Cholecalciferol (VITAMIN D3) 5000 units CAPS Take 1 capsule by mouth daily.  . NON FORMULARY CBD OIL  . sildenafil (REVATIO) 20 MG tablet TAKE 1 TABLET BY MOUTH AS NEEDED  . spironolactone (ALDACTONE) 25 MG tablet TAKE 1 TABLET ONCE A DAY  . telmisartan (MICARDIS) 80 MG tablet Take 1 tablet (80 mg total) by mouth daily.    Cardiac Studies:   Echocardiogram 11/07/2015: Left ventricle cavity is normal in size. Mild concentric hypertrophy of the left ventricle. Normal diastolic filling pattern. Left ventricle regional wall motion findings: Apical anterior, Apical septal and Apical mild hypokinesis. Visual EF is 50-55%. Trace mitral regurgitation. Mild tricuspid regurgitation. No evidence of pulmonary hypertension. Compared to the echocardiogram 10/03/2015, EF improved from 30-35 percent.  Coronary angiogram 10/02/2015: 1. Ost LAD to Prox LAD  lesion, 99 %stenosed. XIENCE ALPINE RX 13/05/2018 drug eluting stent. Post intervention, there is a 0% residual stenosis. TIMI flow improved from TIMI 1 to TIMI 3 at end of the procedure. Otherwise normal coronary arteries. 2. There is severe left ventricular systolic dysfunction. The left ventricular ejection fraction is less than 25% by visual estimate.  EKG:   EKG 06/21/2019: Normal sinus rhythm with rate of 73 bpm, normal axis.  Poor R wave progression, cannot exclude anteroseptal infarct old.  Normal QT interval.  No evidence of ischemia. Lov voltage.  No significant change from EKG 12/21/2018.  Assessment:     ICD-10-CM   1. Coronary artery disease involving native coronary artery of native heart without angina pectoris  I25.10   2. Essential hypertension  I10   3. Hypercholesteremia  E78.00 Red Yeast Rice 600 MG TABS  4. Milk allergy  Z91.011    Recommendations:   Daniel Walter  is a 64 y.o.  male  with past medical history of CAD S/P  stenting with a Xience Alpine 3.5 x 23 mm DES. In 2013, diet controlled diabetes, hypertension, hyperlipidemia, GERD, and erectile dysfunction, now on Telmisartan and tolerating this well, could not tolerate generic Diovan.  He is now having difficulty with taking carvedilol but did not want to change the medication as he needs it for cardiac standpoint, I have given him a list of beta-blockers that he can check with pharmacy to see if they have any milk products.  He has multiple medication allergies, LDL Is not at goal.  I reviewed his lipids, in view of his known coronary disease, would like to have his LDL less than 70.  However due to his multiple medication intolerances, I have recommended he try red yeast rice 2 capsules in the evening.  Blood pressure is now well controlled since being on telmisartan 80 mg.  He is tolerating carvedilol.  Continue the same, I will see him back in 1 year or sooner if problems.   Yates Decamp, MD, [facility] 09/30/2019, 9:23  AM Office: [phone]

## 2019-10-29 ENCOUNTER — Other Ambulatory Visit: Payer: Self-pay | Admitting: Cardiology

## 2019-10-29 DIAGNOSIS — I10 Essential (primary) hypertension: Secondary | ICD-10-CM

## 2019-11-15 DIAGNOSIS — Z0000 Encounter for general adult medical examination without abnormal findings: Secondary | ICD-10-CM | POA: Diagnosis not present

## 2019-11-15 DIAGNOSIS — Z23 Encounter for immunization: Secondary | ICD-10-CM | POA: Diagnosis not present

## 2019-11-15 DIAGNOSIS — M25511 Pain in right shoulder: Secondary | ICD-10-CM | POA: Diagnosis not present

## 2019-11-15 DIAGNOSIS — E7800 Pure hypercholesterolemia, unspecified: Secondary | ICD-10-CM | POA: Diagnosis not present

## 2019-11-15 DIAGNOSIS — I2510 Atherosclerotic heart disease of native coronary artery without angina pectoris: Secondary | ICD-10-CM | POA: Diagnosis not present

## 2019-11-15 DIAGNOSIS — R739 Hyperglycemia, unspecified: Secondary | ICD-10-CM | POA: Diagnosis not present

## 2019-11-15 DIAGNOSIS — I10 Essential (primary) hypertension: Secondary | ICD-10-CM | POA: Diagnosis not present

## 2019-11-15 DIAGNOSIS — Z125 Encounter for screening for malignant neoplasm of prostate: Secondary | ICD-10-CM | POA: Diagnosis not present

## 2019-11-25 DIAGNOSIS — M75101 Unspecified rotator cuff tear or rupture of right shoulder, not specified as traumatic: Secondary | ICD-10-CM | POA: Diagnosis not present

## 2019-12-01 ENCOUNTER — Other Ambulatory Visit: Payer: Self-pay | Admitting: Cardiology

## 2019-12-17 NOTE — Progress Notes (Signed)
Labs 12/16/2019:  Total cholesterol 150, triglycerides 159, HDL 41, LDL 82.  Non-HDL cholesterol 109.  Labs 01/05/2019: Total cholesterol 154, triglycerides 134, HDL 39, LDL 89.  Non-HDL cholesterol 115.  Patient was recommended red yeast rice, minimal improvement in HDL and also LDL, non-HDL cholesterol.  Patient intolerant to many other statins, does not want to make changes to his medications, continue the same for now.

## 2019-12-30 ENCOUNTER — Other Ambulatory Visit: Payer: Self-pay | Admitting: Cardiology

## 2020-01-28 ENCOUNTER — Other Ambulatory Visit: Payer: Self-pay

## 2020-01-28 ENCOUNTER — Encounter (HOSPITAL_COMMUNITY): Payer: Self-pay

## 2020-01-28 ENCOUNTER — Observation Stay (HOSPITAL_COMMUNITY)
Admission: EM | Admit: 2020-01-28 | Discharge: 2020-01-30 | Disposition: A | Discharge status: Home / Self Care | Payer: 59 | Attending: Family Medicine | Admitting: Family Medicine

## 2020-01-28 DIAGNOSIS — Z20822 Contact with and (suspected) exposure to covid-19: Secondary | ICD-10-CM | POA: Diagnosis not present

## 2020-01-28 DIAGNOSIS — R739 Hyperglycemia, unspecified: Secondary | ICD-10-CM

## 2020-01-28 DIAGNOSIS — I2510 Atherosclerotic heart disease of native coronary artery without angina pectoris: Secondary | ICD-10-CM | POA: Diagnosis not present

## 2020-01-28 DIAGNOSIS — R197 Diarrhea, unspecified: Secondary | ICD-10-CM

## 2020-01-28 DIAGNOSIS — Z7982 Long term (current) use of aspirin: Secondary | ICD-10-CM | POA: Insufficient documentation

## 2020-01-28 DIAGNOSIS — I10 Essential (primary) hypertension: Secondary | ICD-10-CM | POA: Diagnosis not present

## 2020-01-28 DIAGNOSIS — E860 Dehydration: Secondary | ICD-10-CM

## 2020-01-28 DIAGNOSIS — R55 Syncope and collapse: Secondary | ICD-10-CM | POA: Diagnosis present

## 2020-01-28 DIAGNOSIS — E8809 Other disorders of plasma-protein metabolism, not elsewhere classified: Secondary | ICD-10-CM

## 2020-01-28 DIAGNOSIS — Z79899 Other long term (current) drug therapy: Secondary | ICD-10-CM | POA: Diagnosis not present

## 2020-01-28 DIAGNOSIS — D72829 Elevated white blood cell count, unspecified: Secondary | ICD-10-CM

## 2020-01-28 LAB — CBC WITH DIFFERENTIAL/PLATELET
Abs Immature Granulocytes: 0.08 10*3/uL — ABNORMAL HIGH (ref 0.00–0.07)
Basophils Absolute: 0.1 10*3/uL (ref 0.0–0.1)
Basophils Relative: 0 %
Eosinophils Absolute: 0.1 10*3/uL (ref 0.0–0.5)
Eosinophils Relative: 1 %
HCT: 42.4 % (ref 39.0–52.0)
Hemoglobin: 14.7 g/dL (ref 13.0–17.0)
Immature Granulocytes: 1 %
Lymphocytes Relative: 2 %
Lymphs Abs: 0.3 10*3/uL — ABNORMAL LOW (ref 0.7–4.0)
MCH: 32.2 pg (ref 26.0–34.0)
MCHC: 34.7 g/dL (ref 30.0–36.0)
MCV: 93 fL (ref 80.0–100.0)
Monocytes Absolute: 0.5 10*3/uL (ref 0.1–1.0)
Monocytes Relative: 4 %
Neutro Abs: 12.4 10*3/uL — ABNORMAL HIGH (ref 1.7–7.7)
Neutrophils Relative %: 92 %
Platelets: 228 10*3/uL (ref 150–400)
RBC: 4.56 MIL/uL (ref 4.22–5.81)
RDW: 11.9 % (ref 11.5–15.5)
WBC: 13.4 10*3/uL — ABNORMAL HIGH (ref 4.0–10.5)
nRBC: 0 % (ref 0.0–0.2)

## 2020-01-28 LAB — COMPREHENSIVE METABOLIC PANEL
ALT: 17 U/L (ref 0–44)
AST: 16 U/L (ref 15–41)
Albumin: 3.3 g/dL — ABNORMAL LOW (ref 3.5–5.0)
Alkaline Phosphatase: 59 U/L (ref 38–126)
Anion gap: 8 (ref 5–15)
BUN: 24 mg/dL — ABNORMAL HIGH (ref 8–23)
CO2: 21 mmol/L — ABNORMAL LOW (ref 22–32)
Calcium: 8 mg/dL — ABNORMAL LOW (ref 8.9–10.3)
Chloride: 103 mmol/L (ref 98–111)
Creatinine, Ser: 0.93 mg/dL (ref 0.61–1.24)
GFR, Estimated: >60 mL/min (ref >60)
Glucose, Bld: 219 mg/dL — ABNORMAL HIGH (ref 70–99)
Potassium: 4.8 mmol/L (ref 3.5–5.1)
Sodium: 132 mmol/L — ABNORMAL LOW (ref 135–145)
Total Bilirubin: 0.7 mg/dL (ref 0.3–1.2)
Total Protein: 6 g/dL — ABNORMAL LOW (ref 6.5–8.1)

## 2020-01-28 MED ORDER — SODIUM CHLORIDE 0.9 % IV BOLUS (SEPSIS): 1000.0000 mL | Freq: Once | INTRAVENOUS | Status: DC

## 2020-01-28 NOTE — ED Triage Notes (Signed)
EMS states that patient had a syncopal event at home followed by episode of diarrhea stool.  #18 L AC in place with Normal saline bolus infusing.

## 2020-01-29 ENCOUNTER — Observation Stay (HOSPITAL_BASED_OUTPATIENT_CLINIC_OR_DEPARTMENT_OTHER): Payer: 59

## 2020-01-29 ENCOUNTER — Observation Stay (HOSPITAL_COMMUNITY): Payer: 59

## 2020-01-29 DIAGNOSIS — R55 Syncope and collapse: Secondary | ICD-10-CM | POA: Diagnosis not present

## 2020-01-29 DIAGNOSIS — E8809 Other disorders of plasma-protein metabolism, not elsewhere classified: Secondary | ICD-10-CM

## 2020-01-29 DIAGNOSIS — E860 Dehydration: Secondary | ICD-10-CM | POA: Diagnosis not present

## 2020-01-29 DIAGNOSIS — I10 Essential (primary) hypertension: Secondary | ICD-10-CM

## 2020-01-29 DIAGNOSIS — D72829 Elevated white blood cell count, unspecified: Secondary | ICD-10-CM

## 2020-01-29 DIAGNOSIS — R739 Hyperglycemia, unspecified: Secondary | ICD-10-CM

## 2020-01-29 DIAGNOSIS — R197 Diarrhea, unspecified: Secondary | ICD-10-CM | POA: Diagnosis not present

## 2020-01-29 LAB — MAGNESIUM: Magnesium: 1.5 mg/dL — ABNORMAL LOW (ref 1.7–2.4)

## 2020-01-29 LAB — TROPONIN I (HIGH SENSITIVITY)
Troponin I (High Sensitivity): 2 ng/L (ref <18)
Troponin I (High Sensitivity): 3 ng/L (ref <18)

## 2020-01-29 LAB — CBC
HCT: 41.4 % (ref 39.0–52.0)
Hemoglobin: 14.4 g/dL (ref 13.0–17.0)
MCH: 32 pg (ref 26.0–34.0)
MCHC: 34.8 g/dL (ref 30.0–36.0)
MCV: 92 fL (ref 80.0–100.0)
Platelets: 218 10*3/uL (ref 150–400)
RBC: 4.5 MIL/uL (ref 4.22–5.81)
RDW: 11.9 % (ref 11.5–15.5)
WBC: 9.3 10*3/uL (ref 4.0–10.5)
nRBC: 0 % (ref 0.0–0.2)

## 2020-01-29 LAB — COMPREHENSIVE METABOLIC PANEL
ALT: 16 U/L (ref 0–44)
AST: 17 U/L (ref 15–41)
Albumin: 3.2 g/dL — ABNORMAL LOW (ref 3.5–5.0)
Alkaline Phosphatase: 54 U/L (ref 38–126)
Anion gap: 8 (ref 5–15)
BUN: 19 mg/dL (ref 8–23)
CO2: 20 mmol/L — ABNORMAL LOW (ref 22–32)
Calcium: 8.1 mg/dL — ABNORMAL LOW (ref 8.9–10.3)
Chloride: 105 mmol/L (ref 98–111)
Creatinine, Ser: 0.69 mg/dL (ref 0.61–1.24)
GFR, Estimated: >60 mL/min (ref >60)
Glucose, Bld: 163 mg/dL — ABNORMAL HIGH (ref 70–99)
Potassium: 3.8 mmol/L (ref 3.5–5.1)
Sodium: 133 mmol/L — ABNORMAL LOW (ref 135–145)
Total Bilirubin: 0.8 mg/dL (ref 0.3–1.2)
Total Protein: 5.8 g/dL — ABNORMAL LOW (ref 6.5–8.1)

## 2020-01-29 LAB — ECHOCARDIOGRAM COMPLETE
Area-P 1/2: 3.02 cm2
Height: 70 in
S' Lateral: 2.1 cm
Weight: 2960 oz

## 2020-01-29 LAB — APTT: aPTT: 27 seconds (ref 24–36)

## 2020-01-29 LAB — PHOSPHORUS: Phosphorus: 3.6 mg/dL (ref 2.5–4.6)

## 2020-01-29 LAB — HIV ANTIBODY (ROUTINE TESTING W REFLEX): HIV Screen 4th Generation wRfx: NONREACTIVE

## 2020-01-29 LAB — CK: Total CK: 25 U/L — ABNORMAL LOW (ref 49–397)

## 2020-01-29 LAB — RESP PANEL BY RT-PCR (FLU A&B, COVID) ARPGX2
Influenza A by PCR: NEGATIVE
Influenza B by PCR: NEGATIVE
SARS Coronavirus 2 by RT PCR: NEGATIVE

## 2020-01-29 LAB — PROTIME-INR
INR: 1.1 (ref 0.8–1.2)
Prothrombin Time: 13.3 seconds (ref 11.4–15.2)

## 2020-01-29 MED ORDER — ENOXAPARIN SODIUM 40 MG/0.4ML ~~LOC~~ SOLN
40.0000 mg | SUBCUTANEOUS | Status: DC
  Administered 2020-01-29: 40 mg via SUBCUTANEOUS
  Filled 2020-01-29 (×2): qty 0.4

## 2020-01-29 MED ORDER — SODIUM CHLORIDE 0.9 % IV SOLN: Freq: Once | INTRAVENOUS | Status: AC

## 2020-01-29 NOTE — H&P (Signed)
History and Physical  AIME MELOCHE VCB:[id number] DOB: 1955-07-10 DOA: 01/28/2020  Referring physician: Zadie Rhine, MD PCP: Jarrett Soho, PA-C  Patient coming from: Home  Chief Complaint: Loss of consciousness  HPI: Daniel Walter is a 64 y.o. male with medical history significant for Anterolateral MI, CAD S/P PTCA and stenting of Ost LAD to Prox LAD with a Xience Alpine Rx 3.5 x 23 DES; stenosis reduced from 99% to 0% (2017), HTN, dairy and gluten intolerance who presents to the emergency room due to sudden loss of consciousness at home.  Patient states that he felt very tired all day today and has had minimal abdominal cramping.  Patient states that he took his BP meds around 6:30 PM to 7 PM and around 9 PM of 11/26, he stood up to go use the restroom, but he fainted for less than a minute (per wife at bedside), patient did not have any postictal state and denies biting of tongue or lips.  He did not sustain any traumatic injury except for a slight bruise on lateral right side of the chest.  On second attempt of standing up, he fainted again and this time, he had a large nonbloody bowel movement.  There was still no postictal state and the episode only lasted a few seconds.  Patient denies chest pain, shortness of breath, fever, chills.  EMS was activated and patient was taken to the ED for further evaluation and management.  ED Course:  In the emergency department, he was slightly tachypneic, BP was soft at 90/58.  Orthostatic vital signs, though negative, showed BP on standing to be 83/69.  Work-up in the ED showed mild leukocytosis, hyperglycemia, hypoalbuminemia.  Respiratory panel for influenza A, B and SARS coronavirus 2 was negative.  IV hydration was provided.  Hospitalist was asked to admit patient for further evaluation and management.  Review of Systems: Constitutional: Negative for chills and fever.  HENT: Negative for ear pain and sore throat.   Eyes: Negative for pain  and visual disturbance.  Respiratory: Negative for cough, chest tightness and shortness of breath.   Cardiovascular: Positive for syncope.  Negative for chest pain and palpitations.  Gastrointestinal: Positive for diarrhea and mild abdominal pain.  Negative for vomiting.  Endocrine: Negative for polyphagia and polyuria.  Genitourinary: Negative for decreased urine volume, dysuria, enuresis Musculoskeletal: Negative for arthralgias and back pain.  Skin: Negative for color change and rash.  Allergic/Immunologic: Negative for immunocompromised state.  Neurological: Positive for syncope negative for tremors, speech difficulty, weakness, light-headedness and headaches.  Hematological: Does not bruise/bleed easily.  All other systems reviewed and are negative    Past Medical History:  Diagnosis Date  . CAD (coronary artery disease)   . Coronary artery disease   . ED (erectile dysfunction)   . GERD (gastroesophageal reflux disease)   . Hypertension   . MI (myocardial infarction) (HCC)   . Myalgia due to statin    Past Surgical History:  Procedure Laterality Date  . APPENDECTOMY    . CARDIAC CATHETERIZATION N/A 10/02/2015   Procedure: Left Heart Cath and Coronary Angiography;  Surgeon: Yates Decamp, MD;  Location: [facility] INVASIVE CV LAB;  Service: Cardiovascular;  Laterality: N/A;  . CARDIAC CATHETERIZATION N/A 10/02/2015   Procedure: Coronary Stent Intervention;  Surgeon: Yates Decamp, MD;  Location: [facility] INVASIVE CV LAB;  Service: Cardiovascular;  Laterality: N/A;  Prox LAD  . CHOLECYSTECTOMY    . CORONARY ANGIOPLASTY    . KNEE ARTHROSCOPY  Social History:  reports that he has never smoked. He has never used smokeless tobacco. He reports current alcohol use. He reports that he does not use drugs.   Allergies  Allergen Reactions  . Gluten Meal   . Milk-Related Compounds Diarrhea and Other (See Comments)    NO DAIRY; LETHARGY (ALSO)    Family History  Problem Relation Age of Onset  .  Heart attack Mother   . Aneurysm Father     Prior to Admission medications   Medication Sig Start Date End Date Taking? Authorizing Provider  aspirin 81 MG chewable tablet Chew 1 tablet (81 mg total) by mouth daily. 10/04/15   Marcy Salvo, NP  carvedilol (COREG) 12.5 MG tablet TAKE  (1)  TABLET TWICE A DAY. 12/30/19   Yates Decamp, MD  Cholecalciferol (VITAMIN D3) 5000 units CAPS Take 1 capsule by mouth daily.    [provider]  NON FORMULARY CBD OIL    [provider]  Red Yeast Rice 600 MG TABS Take 2 tablets (1,200 mg total) by mouth daily after supper. 09/30/19   Yates Decamp, MD  sildenafil (REVATIO) 20 MG tablet TAKE 1 TABLET BY MOUTH AS NEEDED 12/30/19   Yates Decamp, MD  spironolactone (ALDACTONE) 25 MG tablet TAKE 1 TABLET ONCE A DAY 10/29/19   Yates Decamp, MD  telmisartan (MICARDIS) 80 MG tablet Take 1 tablet (80 mg total) by mouth daily. 06/21/19   Yates Decamp, MD    Physical Exam: BP 97/68   Pulse 80   Temp 98.5 F (36.9 C) (Oral)   Resp (!) 27   Ht 5\' 10"  (1.778 m)   Wt 83.9 kg   SpO2 98%   BMI 26.54 kg/m   . General: 64 y.o. year-old male well developed well nourished in no acute distress.  Alert and oriented x3. 68 HEENT: Dry mucous membrane.  NCAT, EOMI . Neck: Supple, trachea media . Cardiovascular: Regular rate and rhythm with no rubs or gallops.  No thyromegaly or JVD noted.  No lower extremity edema. 2/4 pulses in all 4 extremities. Marland Kitchen Respiratory: Clear to auscultation with no wheezes or rales. Good inspiratory effort. . Abdomen: Soft nontender nondistended with normal bowel sounds x4 quadrants. . Muskuloskeletal: No cyanosis, clubbing or edema noted bilaterally . Neuro: CN II-XII intact, strength, sensation, reflexes . Skin: No ulcerative lesions noted or rashes . Psychiatry: Judgement and insight appear normal. Mood is appropriate for condition and setting          Labs on Admission:  Basic Metabolic Panel: Recent Labs  Lab 01/28/20 2318    NA 132*  K 4.8  CL 103  CO2 21*  GLUCOSE 219*  BUN 24*  CREATININE 0.93  CALCIUM 8.0*   Liver Function Tests: Recent Labs  Lab 01/28/20 2318  AST 16  ALT 17  ALKPHOS 59  BILITOT 0.7  PROT 6.0*  ALBUMIN 3.3*   No results for input(s): LIPASE, AMYLASE in the last 168 hours. No results for input(s): AMMONIA in the last 168 hours. CBC: Recent Labs  Lab 01/28/20 2318  WBC 13.4*  NEUTROABS 12.4*  HGB 14.7  HCT 42.4  MCV 93.0  PLT 228   Cardiac Enzymes: No results for input(s): CKTOTAL, CKMB, CKMBINDEX, TROPONINI in the last 168 hours.  BNP (last 3 results) No results for input(s): BNP in the last 8760 hours.  ProBNP (last 3 results) No results for input(s): PROBNP in the last 8760 hours.  CBG: No results for input(s): GLUCAP in the  last 168 hours.  Radiological Exams on Admission: No results found.  EKG: I independently viewed the EKG done and my findings are as followed: Sinus rhythm at a rate of 86 bpm  Assessment/Plan Present on Admission: . Syncope and collapse  Principal Problem:   Syncope and collapse Active Problems:   Diarrhea   Dehydration   Hypoalbuminemia   Hyperglycemia   Leukocytosis  Syncope and collapse Patient will be admitted to telemetry monitored unit, watch for arrhythmias Troponins will be checked EKG showed normal sinus rhythm at rate of 86 bpm  Echocardiogram to rule out significant aortic stenosis or other outflow obstruction, and also to evaluate EF and to rule out segmental/Regional wall motion abnormalities will be done in the morning  Last echocardiogram done in August 2017 showed LVEF of 30-35% with regional wall motion abnormalities Check carotid artery Dopplers to rule out hemodynamically significant stenosis EEG will be checked to ensure no seizures considering diarrhea during a syncope episode ( ??  Incontinence)  Dehydration Continue IV hydration  Leukocytosis possibly reactive WBC 13.4, no obvious sign of an  acute infectious process (except if diarrhea is infectious) Patient with no fever, chills, nausea, vomiting Continue to monitor WBC with morning labs  Hyperglycemia with no known history of type 2 diabetes mellitus Blood glucose = 219  Hemoglobin A1c will be checked  Hypoalbuminemia possibly secondary to mild protein calorie malnutrition Albumin 3.3, stable  ??Diarrhea Patient has only had one episode (none yet since arrival to the ED) Consider checking for C. difficile/GI panel if diarrhea persists  CAD S/P CABG/essential hypertension BP meds will be held at this time due to soft BP Consider BP meds dose adjustment based on patient's BP at the time of discharge  Dairy and gluten allergy Avoid dairy products, wheat, rye, barley etc.  DVT prophylaxis: Lovenox  Code Status: Full code  Family Communication: Wife at bedside (all questions answered to satisfaction)  Disposition Plan:  Patient is from:                        home Anticipated DC to:                   home Anticipated DC date:               1 day Anticipated DC barriers:           Patient is unstable to be discharged at this time due to syncopal episode and requirement for work-up in the morning   Consults called: None  Admission status: Observation    Frankey Shown MD [facility]  01/29/2020, 1:52 AM

## 2020-01-29 NOTE — Progress Notes (Signed)
Patient seen and examined at bedside, patient admitted after midnight, please see earlier detailed admission note by Frankey Shown, DO. Briefly, patient presented secondary to two syncopal episodes of unknown etiology.  BP 97/67   Pulse 79   Temp 98.5 F (36.9 C) (Oral)   Resp (!) 28   Ht 5\' 10"  (1.778 m)   Wt 83.9 kg   SpO2 97%   BMI 26.54 kg/m   General exam: Appears calm and comfortable  Respiratory system: Clear to auscultation. Respiratory effort normal. Cardiovascular system: S1 & S2 heard, RRR. Gastrointestinal system: Abdomen is slightly distended, soft and nontender. No organomegaly or masses felt. Normal bowel sounds heard. Central nervous system: Alert and oriented. No focal neurological deficits. Musculoskeletal: No edema. No calf tenderness Skin: No cyanosis. No rashes Psychiatry: Judgement and insight appear normal. Mood & affect appropriate.   Brief A/P:  Syncope  Likely related to orthostatic hypotension in setting of BP meds. Questionable neurologic event. Blood pressure still on soft side this morning -Follow-up Transthoracic Echocardiogram, EEG -Continue telemetry -Hold antihypertensives (Coreg, telmisartan, spironolactone) overnight if able to tolerate -Serial orthostatic vitals  Disposition: anticipate discharge home in 24 hours pending continued workup for syncope in addition to improvement of BP   , MD [facility] 01/29/2020, 9:16 AM

## 2020-01-29 NOTE — ED Notes (Signed)
PT report called to Trish RN, verbalized complete understanding of Pt plan of care and current condition, denies questions at this time. Pt VSS NAD PT remains a/o x 4 skin warm dry intact. Pt denies needs at this time. Pt is hemodynamically stable and ready for transport to clean ready room.

## 2020-01-29 NOTE — ED Provider Notes (Signed)
[facility] EMERGENCY DEPARTMENT Provider Note   CSN: [id number] Arrival date & time: 01/28/20  2219     History Chief Complaint  Patient presents with   Loss of Consciousness    Daniel Walter is a 64 y.o. male.  The history is provided by the patient and the spouse.  Loss of Consciousness Episode history:  Multiple Most recent episode:  Today Timing:  Intermittent Progression:  Improving Chronicity:  New Witnessed: yes   Relieved by:  Nothing Worsened by:  Nothing Associated symptoms: no chest pain, no difficulty breathing, no fever, no seizures, no shortness of breath and no vomiting   Risk factors: coronary artery disease   Risk factors: no seizures   Patient with history of CAD presents after 2 syncopal episodes.  Patient reports he felt fatigued all day with some mild abdominal cramping. Around 9 PM on November 26, patient stood up and had a syncopal episode.  Wife reports that he lost complete consciousness.  No traumatic injuries.  After that he woke up and tried to walk again and had another syncopal episode. No seizures were reported.  After his second syncopal episode he had a large nonbloody bowel movement.  No vomiting.  No chest pain or shortness of breath.  Patient reports he may been exposed to dairy while eating Thanksgiving dinner and that usually triggers diarrhea No significant abdominal pain at this time     Past Medical History:  Diagnosis Date   CAD (coronary artery disease)    Coronary artery disease    ED (erectile dysfunction)    GERD (gastroesophageal reflux disease)    Hypertension    MI (myocardial infarction) (HCC)    Myalgia due to statin     Patient Active Problem List   Diagnosis Date Noted   Hypercholesteremia 05/01/2018   Essential hypertension 05/01/2018   Myalgia due to statin 05/01/2018   History of MI (myocardial infarction) 05/01/2018   MI (myocardial infarction) [facility])    CAD (coronary artery disease)    ED  (erectile dysfunction)    CAD (coronary artery disease), native coronary artery 10/02/2015    Past Surgical History:  Procedure Laterality Date   APPENDECTOMY     CARDIAC CATHETERIZATION N/A 10/02/2015   Procedure: Left Heart Cath and Coronary Angiography;  Surgeon: Yates Decamp, MD;  Location: [facility] INVASIVE CV LAB;  Service: Cardiovascular;  Laterality: N/A;   CARDIAC CATHETERIZATION N/A 10/02/2015   Procedure: Coronary Stent Intervention;  Surgeon: Yates Decamp, MD;  Location: [facility] INVASIVE CV LAB;  Service: Cardiovascular;  Laterality: N/A;  Prox LAD   CHOLECYSTECTOMY     CORONARY ANGIOPLASTY     KNEE ARTHROSCOPY         Family History  Problem Relation Age of Onset   Heart attack Mother    Aneurysm Father     Social History   Tobacco Use   Smoking status: Never Smoker   Smokeless tobacco: Never Used  Vaping Use   Vaping Use: Never used  Substance Use Topics   Alcohol use: Yes    Comment: occ   Drug use: No    Home Medications Prior to Admission medications   Medication Sig Start Date End Date Taking? Authorizing Provider  aspirin 81 MG chewable tablet Chew 1 tablet (81 mg total) by mouth daily. 10/04/15   Marcy Salvo, NP  carvedilol (COREG) 12.5 MG tablet TAKE  (1)  TABLET TWICE A DAY. 12/30/19   Yates Decamp, MD  Cholecalciferol (VITAMIN D3) 5000 units CAPS  Take 1 capsule by mouth daily.    [provider]  NON FORMULARY CBD OIL    [provider]  Red Yeast Rice 600 MG TABS Take 2 tablets (1,200 mg total) by mouth daily after supper. 09/30/19   Yates Decamp, MD  sildenafil (REVATIO) 20 MG tablet TAKE 1 TABLET BY MOUTH AS NEEDED 12/30/19   Yates Decamp, MD  spironolactone (ALDACTONE) 25 MG tablet TAKE 1 TABLET ONCE A DAY 10/29/19   Yates Decamp, MD  telmisartan (MICARDIS) 80 MG tablet Take 1 tablet (80 mg total) by mouth daily. 06/21/19   Yates Decamp, MD    Allergies    Gluten meal and Milk-related compounds  Review of Systems   Review of  Systems  Constitutional: Negative for fever.  Respiratory: Negative for shortness of breath.   Cardiovascular: Positive for syncope. Negative for chest pain.  Gastrointestinal: Positive for diarrhea. Negative for blood in stool and vomiting.  Neurological: Positive for syncope. Negative for seizures.  All other systems reviewed and are negative.   Physical Exam Updated Vital Signs BP (!) 95/54    Pulse 83    Temp 98.5 F (36.9 C) (Oral)    Resp (!) 22    Ht 1.778 m (5\' 10" )    Wt 83.9 kg    SpO2 99%    BMI 26.54 kg/m   Physical Exam CONSTITUTIONAL: Well developed/well nourished HEAD: Normocephalic/atraumatic, no signs of trauma EYES: EOMI/PERRL NECK: supple no meningeal signs SPINE/BACK:entire spine nontender CV: S1/S2 noted, no murmurs/rubs/gallops noted LUNGS: Lungs are clear to auscultation bilaterally, no apparent distress ABDOMEN: soft, nontender, no rebound or guarding, bowel sounds noted throughout abdomen GU:no cva tenderness NEURO: Pt is awake/alert/appropriate, moves all extremitiesx4.  No facial droop.  No arm or leg drift EXTREMITIES: pulses normal/equal, full ROM, all other extremities/joints palpated/ranged and nontender SKIN: warm, color normal PSYCH: no abnormalities of mood noted, alert and oriented to situation  ED Results / Procedures / Treatments   Labs (all labs ordered are listed, but only abnormal results are displayed) Labs Reviewed  CBC WITH DIFFERENTIAL/PLATELET - Abnormal; Notable for the following components:      Result Value   WBC 13.4 (*)    Neutro Abs 12.4 (*)    Lymphs Abs 0.3 (*)    Abs Immature Granulocytes 0.08 (*)    All other components within normal limits  COMPREHENSIVE METABOLIC PANEL - Abnormal; Notable for the following components:   Sodium 132 (*)    CO2 21 (*)    Glucose, Bld 219 (*)    BUN 24 (*)    Calcium 8.0 (*)    Total Protein 6.0 (*)    Albumin 3.3 (*)    All other components within normal limits  RESP PANEL BY  RT-PCR (FLU A&B, COVID) ARPGX2    EKG EKG Interpretation  Date/Time:  Friday January 28 2020 22:27:21 EST Ventricular Rate:  86 PR Interval:    QRS Duration: 93 QT Interval:  368 QTC Calculation: 441 R Axis:   55 Text Interpretation: Sinus rhythm Borderline low voltage, extremity leads Confirmed by [id number] (Zadie Rhine) on 01/28/2020 11:05:51 PM   Radiology No results found.  Procedures Procedures Medications Ordered in ED Medications - No data to display  ED Course  I have reviewed the triage vital signs and the nursing notes.  Pertinent labsresults that were available during my care of the patient were reviewed by me and considered in my medical decision making (see chart for details).  MDM Rules/Calculators/A&P                          12:20 AM Patient with history of CAD presents for 2 syncopal episodes.  They were followed by a large episode of diarrhea. However he has had no vomiting or diarrhea throughout the day. Patient did have orthostatic vital sign changes here.  After up to 2 L of fluid, blood pressure still in the high 90s. He has had a previous history of a low ejection fraction that may have recovered. I am concerned the patient is high risk for deterioration.  He has been advised admitted to hospital.  Patient agrees with plan Pt denies CP/SOB, low suspicion for ACS at this time 12:34 AM D/w dr Thomes Dinning for admission to hospital  Final Clinical Impression(s) / ED Diagnoses Final diagnoses:  Syncope and collapse  Dehydration    Rx / DC Orders ED Discharge Orders    None       Zadie Rhine, MD 01/29/20 [phone]

## 2020-01-29 NOTE — Progress Notes (Signed)
*  PRELIMINARY RESULTS* Echocardiogram 2D Echocardiogram has been performed.  [name] 01/29/2020, 9:49 AM

## 2020-01-30 DIAGNOSIS — R55 Syncope and collapse: Secondary | ICD-10-CM | POA: Diagnosis not present

## 2020-01-30 MED ORDER — CARVEDILOL 3.125 MG PO TABS
6.2500 mg | ORAL_TABLET | Freq: Two times a day (BID) | ORAL | Status: DC
  Administered 2020-01-30: 6.25 mg via ORAL
  Filled 2020-01-30: qty 2

## 2020-01-30 MED ORDER — CARVEDILOL 12.5 MG PO TABS
6.2500 mg | ORAL_TABLET | Freq: Two times a day (BID) | ORAL | 0 refills | Status: DC
Start: 2020-01-30 — End: 2020-03-13

## 2020-01-30 NOTE — Progress Notes (Signed)
Nsg Discharge Note  Admit Date:  01/28/2020 Discharge date: 01/30/2020   Daniel Walter to be D/C'd Home per MD order.  AVS completed.  Copy for chart, and copy for patient signed, and dated. Removed IV-clean, dry, intact. Reviewed d/c paperwork with patient and wife. Answered all questions. Wheeled stable patient and belongings to main entrance where he was picked up by his wife. Patient/caregiver able to verbalize understanding.  Discharge Medication: Allergies as of 01/30/2020      Reactions   Gluten Meal    Milk-related Compounds Diarrhea, Other (See Comments)   NO DAIRY; LETHARGY (ALSO)   Msud Aid [alitraq]       Medication List    STOP taking these medications   Red Yeast Rice 600 MG Tabs   spironolactone 25 MG tablet Commonly known as: ALDACTONE   telmisartan 80 MG tablet Commonly known as: MICARDIS     TAKE these medications   aspirin 81 MG chewable tablet Chew 1 tablet (81 mg total) by mouth daily.   carvedilol 12.5 MG tablet Commonly known as: COREG Take 0.5 tablets (6.25 mg total) by mouth 2 (two) times daily with a meal. What changed: See the new instructions.   NON FORMULARY Apply 1 application topically daily as needed (pain). CBD OIL   sildenafil 20 MG tablet Commonly known as: REVATIO TAKE 1 TABLET BY MOUTH AS NEEDED What changed:   when to take this  reasons to take this   Vitamin D3 125 MCG (5000 UT) Caps Take 5,000 Units by mouth daily.       Discharge Assessment: Vitals:   01/30/20 0015 01/30/20 0348  BP: 99/65 103/69  Pulse: 83 89  Resp: 16 15  Temp: 99.9 F (37.7 C) 99 F (37.2 C)  SpO2: 98% 97%   Skin clean, dry and intact without evidence of skin break down, no evidence of skin tears noted. IV catheter discontinued intact. Site without signs and symptoms of complications - no redness or edema noted at insertion site, patient denies c/o pain - only slight tenderness at site.  Dressing with slight pressure applied.  D/c  Instructions-Education: Discharge instructions given to patient/family with verbalized understanding. D/c education completed with patient/family including follow up instructions, medication list, d/c activities limitations if indicated, with other d/c instructions as indicated by MD - patient able to verbalize understanding, all questions fully answered. Patient instructed to return to ED, call 911, or call MD for any changes in condition.  Patient escorted via WC, and D/C home via private auto.  Karolee Ohs, RN 01/30/2020 4:26 PM

## 2020-01-30 NOTE — Discharge Instructions (Addendum)
Daniel Walter,  You were in the hospital because of passing out. This appears to be because of low blood pressure. I have adjusted your blood pressure medication. Please continue your Coreg at a reduced dose and discontinue your spironolactone and telmisartan until you can see your cardiologist. Please check your blood pressure on a daily basis.

## 2020-01-30 NOTE — Discharge Summary (Signed)
Physician Discharge Summary  Daniel Walter:[id number] DOB: 09-15-1955 DOA: 01/28/2020  PCP: Jarrett Soho, PA-C  Admit date: 01/28/2020 Discharge date: 01/30/2020  Admitted From: Home Disposition: Home  Recommendations for Outpatient Follow-up:  1. Follow up with PCP in 1 week 2. Follow up with cardiology 3. Please obtain BMP/CBC in one week 4. Please follow up on the following pending results: None  Home Health: None Equipment/Devices: None  Discharge Condition: Stable CODE STATUS: Full code Diet recommendation: Heart healthy   Brief/Interim Summary:  Admission HPI written by Frankey Shown, DO   Chief Complaint: Loss of consciousness  HPI: Daniel Walter is a 64 y.o. male with medical history significant for Anterolateral MI, CAD S/P PTCA and stenting of Ost LAD to Prox LAD with a Xience Alpine Rx 3.5 x 23 DES; stenosis reduced from 99% to 0% (2017), HTN, dairy and gluten intolerance who presents to the emergency room due to sudden loss of consciousness at home.  Patient states that he felt very tired all day today and has had minimal abdominal cramping.  Patient states that he took his BP meds around 6:30 PM to 7 PM and around 9 PM of 11/26, he stood up to go use the restroom, but he fainted for less than a minute (per wife at bedside), patient did not have any postictal state and denies biting of tongue or lips.  He did not sustain any traumatic injury except for a slight bruise on lateral right side of the chest.  On second attempt of standing up, he fainted again and this time, he had a large nonbloody bowel movement.  There was still no postictal state and the episode only lasted a few seconds.  Patient denies chest pain, shortness of breath, fever, chills.  EMS was activated and patient was taken to the ED for further evaluation and management.   Hospital course:  Syncope  Likely related to orthostatic hypotension in setting of BP meds. Questionable  neurologic event. No arrhythmia on telemetry. EEG unable to be obtained secondary to availability, however low concern for seizure. Patient given some IV fluids and antihypertensives held. Orthostatic vitals initially positive but prior to discharge, this improved. Patient restarted on lower dose of Coreg prior to discharge and is maintaining blood pressure. PCP follow-up.  Chronic combined systolic and diastolic heart failure CAD s/p PCI history New EF of 55%. Holding telmisartan and spironolactone secondary to above. Continue Coreg. Cardiology follow-up. Continue aspirin.  Discharge Diagnoses:  Principal Problem:   Syncope and collapse Active Problems:   CAD (coronary artery disease), native coronary artery   Essential hypertension   Diarrhea   Dehydration   Hypoalbuminemia   Hyperglycemia   Leukocytosis    Discharge Instructions   Allergies as of 01/30/2020      Reactions   Gluten Meal    Milk-related Compounds Diarrhea, Other (See Comments)   NO DAIRY; LETHARGY (ALSO)   Msud Aid [alitraq]       Medication List    STOP taking these medications   Red Yeast Rice 600 MG Tabs   spironolactone 25 MG tablet Commonly known as: ALDACTONE   telmisartan 80 MG tablet Commonly known as: MICARDIS     TAKE these medications   aspirin 81 MG chewable tablet Chew 1 tablet (81 mg total) by mouth daily.   carvedilol 12.5 MG tablet Commonly known as: COREG Take 0.5 tablets (6.25 mg total) by mouth 2 (two) times daily with a meal. What changed: See the  new instructions.   NON FORMULARY Apply 1 application topically daily as needed (pain). CBD OIL   sildenafil 20 MG tablet Commonly known as: REVATIO TAKE 1 TABLET BY MOUTH AS NEEDED What changed:   when to take this  reasons to take this   Vitamin D3 125 MCG (5000 UT) Caps Take 5,000 Units by mouth daily.       Allergies  Allergen Reactions  . Gluten Meal   . Milk-Related Compounds Diarrhea and Other (See  Comments)    NO DAIRY; LETHARGY (ALSO)  . Msud Aid [Alitraq]     Consultations:  None   Procedures/Studies: US Carotid Bilateral  Result Date: 01/29/2020 CLINICAL DATA:  Syncope and collapse. History of CAD, post myocardial infarction, and hypertension. EXAM: BILATERAL CAROTID DUPLEX ULTRASOUND TECHNIQUE: Wallace Cullens scale imaging, color Doppler and duplex ultrasound were performed of bilateral carotid and vertebral arteries in the neck. COMPARISON:  None. FINDINGS: Criteria: Quantification of carotid stenosis is based on velocity parameters that correlate the residual internal carotid diameter with NASCET-based stenosis levels, using the diameter of the distal internal carotid lumen as the denominator for stenosis measurement. The following velocity measurements were obtained: RIGHT ICA: 115/36 cm/sec CCA: 86/20 cm/sec SYSTOLIC ICA/CCA RATIO:  0.3 ECA: 116 cm/sec LEFT ICA: 108/35 cm/sec CCA: 88/22 cm/sec SYSTOLIC ICA/CCA RATIO:  1.2 ECA: 113 cm/sec RIGHT CAROTID ARTERY: There is a minimal amount of intimal thickening involving the right carotid bulb (image 14 and 18), not resulting in elevated peak systolic velocities within the interrogated course of the right internal carotid artery to suggest a hemodynamically significant stenosis. RIGHT VERTEBRAL ARTERY:  Antegrade Flow LEFT CAROTID ARTERY: There is a minimal amount of eccentric echogenic plaque involving the left carotid bulb (image 55), not resulting in elevated peak systolic velocities within the interrogated course of the left internal carotid artery to suggest a hemodynamically significant stenosis. LEFT VERTEBRAL ARTERY:  Antegrade flow IMPRESSION: Minimal amount of bilateral intimal thickening/atherosclerotic plaque, left subjectively greater than right, not resulting in a hemodynamically significant stenosis within either internal carotid artery. Electronically Signed   By: Simonne Come M.D.   On: 01/29/2020 12:27   ECHOCARDIOGRAM  COMPLETE  Result Date: 01/29/2020    ECHOCARDIOGRAM REPORT   Patient Name:   Daniel Walter Date of Exam: 01/29/2020 Medical Rec #:  [id number]      Height:       70.0 in Accession #:    [id number]     Weight:       185.0 lb Date of Birth:  Jul 08, 1955       BSA:          2.019 m Patient Age:    64 years       BP:           95/62 mmHg Patient Gender: M              HR:           78 bpm. [facility] Procedure: 2D Echo, Cardiac Doppler and Color Doppler Indications:    Syncope 780.2/R55  History:        Patient has prior history of Echocardiogram examinations, most                 recent 10/03/2015. CAD and Previous Myocardial Infarction; Risk                 Factors:Hypertension and Dyslipidemia. Syncope and collapse.  Sonographer:    Celesta Gentile RCS Referring  Phys: [id number] OLADAPO ADEFESO IMPRESSIONS  1. Left ventricular ejection fraction, by estimation, is 65 to 70%. The left ventricle has normal function. The left ventricle has no regional wall motion abnormalities. There is mild left ventricular hypertrophy. Left ventricular diastolic parameters are consistent with Grade I diastolic dysfunction (impaired relaxation).  2. Right ventricular systolic function is normal. The right ventricular size is normal. Tricuspid regurgitation signal is inadequate for assessing PA pressure.  3. The mitral valve is grossly normal. Trivial mitral valve regurgitation.  4. The aortic valve is tricuspid. Aortic valve regurgitation is not visualized.  5. The inferior vena cava is normal in size with greater than 50% respiratory variability, suggesting right atrial pressure of 3 mmHg.  6. Cannot exclude PFO. FINDINGS  Left Ventricle: Left ventricular ejection fraction, by estimation, is 65 to 70%. The left ventricle has normal function. The left ventricle has no regional wall motion abnormalities. The left ventricular internal cavity size was normal in size. There is  mild left ventricular hypertrophy. Left ventricular  diastolic parameters are consistent with Grade I diastolic dysfunction (impaired relaxation). Indeterminate filling pressures. Right Ventricle: The right ventricular size is normal. No increase in right ventricular wall thickness. Right ventricular systolic function is normal. Tricuspid regurgitation signal is inadequate for assessing PA pressure. Left Atrium: Left atrial size was normal in size. Right Atrium: Right atrial size was normal in size. Pericardium: There is no evidence of pericardial effusion. Mitral Valve: The mitral valve is grossly normal. Trivial mitral valve regurgitation. Tricuspid Valve: The tricuspid valve is grossly normal. Tricuspid valve regurgitation is trivial. Aortic Valve: The aortic valve is tricuspid. Aortic valve regurgitation is not visualized. Pulmonic Valve: The pulmonic valve was normal in structure. Pulmonic valve regurgitation is not visualized. Aorta: The aortic root and ascending aorta are structurally normal, with no evidence of dilitation. Venous: The inferior vena cava is normal in size with greater than 50% respiratory variability, suggesting right atrial pressure of 3 mmHg. IAS/Shunts: Cannot exclude PFO.  LEFT VENTRICLE PLAX 2D LVIDd:         4.20 cm  Diastology LVIDs:         2.10 cm  LV e' medial:    6.85 cm/s LV PW:         1.00 cm  LV E/e' medial:  8.1 LV IVS:        1.10 cm  LV e' lateral:   9.14 cm/s LVOT diam:     2.00 cm  LV E/e' lateral: 6.1 LV SV:         58 LV SV Index:   29 LVOT Area:     3.14 cm  RIGHT VENTRICLE RV S prime:     10.90 cm/s TAPSE (M-mode): 1.9 cm LEFT ATRIUM             Index       RIGHT ATRIUM           Index LA diam:        3.10 cm 1.54 cm/m  RA Area:     16.20 cm LA Vol (A2C):   36.8 ml 18.22 ml/m RA Volume:   44.40 ml  21.99 ml/m LA Vol (A4C):   38.3 ml 18.97 ml/m LA Biplane Vol: 39.2 ml 19.41 ml/m  AORTIC VALVE LVOT Vmax:   89.10 cm/s LVOT Vmean:  59.600 cm/s LVOT VTI:    0.184 m  AORTA Ao Root diam: 3.60 cm MITRAL VALVE MV Area  (PHT): 3.02 cm    SHUNTS MV  Decel Time: 251 msec    Systemic VTI:  0.18 m MV E velocity: 55.40 cm/s  Systemic Diam: 2.00 cm MV A velocity: 73.50 cm/s MV E/A ratio:  0.75 Zoila Shutter MD Electronically signed by Zoila Shutter MD Signature Date/Time: 01/29/2020/1:04:13 PM    Final       Subjective: No issues this morning except his room was very warm.  Discharge Exam: Vitals:   01/30/20 0015 01/30/20 0348  BP: 99/65 103/69  Pulse: 83 89  Resp: 16 15  Temp: 99.9 F (37.7 C) 99 F (37.2 C)  SpO2: 98% 97%   Vitals:   01/29/20 1930 01/29/20 2051 01/30/20 0015 01/30/20 0348  BP: 111/72 113/80 99/65 103/69  Pulse: 84 80 83 89  Resp: (!) Temp:  98.8 F (37.1 C) 99.9 F (37.7 C) 99 F (37.2 C)  TempSrc:  Oral    SpO2: 99% 98% 98% 97%  Weight:      Height:        General: Pt is alert, awake, not in acute distress Cardiovascular: RRR, S1/S2 +, no rubs, no gallops Respiratory: CTA bilaterally, no wheezing, no rhonchi Abdominal: Soft, NT, ND, bowel sounds + Extremities: no edema, no cyanosis    The results of significant diagnostics from this hospitalization (including imaging, microbiology, ancillary and laboratory) are listed below for reference.     Microbiology: Recent Results (from the past 240 hour(s))  Resp Panel by RT-PCR (Flu A&B, Covid) Nasopharyngeal Swab     Status: None   Collection Time: 01/29/20 12:29 AM   Specimen: Nasopharyngeal Swab; Nasopharyngeal(NP) swabs in vial transport medium  Result Value Ref Range Status   SARS Coronavirus 2 by RT PCR NEGATIVE NEGATIVE Final    Comment: (NOTE) SARS-CoV-2 target nucleic acids are NOT DETECTED.  The SARS-CoV-2 RNA is generally detectable in upper respiratory specimens during the acute phase of infection. The lowest concentration of SARS-CoV-2 viral copies this assay can detect is 138 copies/mL. A negative result does not preclude SARS-Cov-2 infection and should not be used as the sole basis for  treatment or other patient management decisions. A negative result may occur with  improper specimen collection/handling, submission of specimen other than nasopharyngeal swab, presence of viral mutation(s) within the areas targeted by this assay, and inadequate number of viral copies(<138 copies/mL). A negative result must be combined with clinical observations, patient history, and epidemiological information. The expected result is Negative.  Fact Sheet for Patients:  BloggerCourse.com  Fact Sheet for Healthcare Providers:  SeriousBroker.it  This test is no t yet approved or cleared by the [place] FDA and  has been authorized for detection and/or diagnosis of SARS-CoV-2 by FDA under an Emergency Use Authorization (EUA). This EUA will remain  in effect (meaning this test can be used) for the duration of the COVID-19 declaration under Section 564(b)(1) of the Act, 21 U.S.C.section 360bbb-3(b)(1), unless the authorization is terminated  or revoked sooner.       Influenza A by PCR NEGATIVE NEGATIVE Final   Influenza B by PCR NEGATIVE NEGATIVE Final    Comment: (NOTE) The Xpert Xpress SARS-CoV-2/FLU/RSV plus assay is intended as an aid in the diagnosis of influenza from Nasopharyngeal swab specimens and should not be used as a sole basis for treatment. Nasal washings and aspirates are unacceptable for Xpert Xpress SARS-CoV-2/FLU/RSV testing.  Fact Sheet for Patients: BloggerCourse.com  Fact Sheet for Healthcare Providers: SeriousBroker.it  This test is not yet approved or cleared by the [place]  [place] FDA and has been authorized for detection and/or diagnosis of SARS-CoV-2 by FDA under an Emergency Use Authorization (EUA). This EUA will remain in effect (meaning this test can be used) for the duration of the COVID-19 declaration under Section 564(b)(1) of the Act, 21  U.S.C. section 360bbb-3(b)(1), unless the authorization is terminated or revoked.  Performed at [facility], [address], [place], [place] [address]      Labs: BNP (last 3 results) No results for input(s): BNP in the last 8760 hours. Basic Metabolic Panel: Recent Labs  Lab 01/28/20 2318 01/29/20 0516  NA 132* 133*  K 4.8 3.8  CL 103 105  CO2 21* 20*  GLUCOSE 219* 163*  BUN 24* 19  CREATININE 0.93 0.69  CALCIUM 8.0* 8.1*  MG  --  1.5*  PHOS  --  3.6   Liver Function Tests: Recent Labs  Lab 01/28/20 2318 01/29/20 0516  AST 16 17  ALT 17 16  ALKPHOS 59 54  BILITOT 0.7 0.8  PROT 6.0* 5.8*  ALBUMIN 3.3* 3.2*   No results for input(s): LIPASE, AMYLASE in the last 168 hours. No results for input(s): AMMONIA in the last 168 hours. CBC: Recent Labs  Lab 01/28/20 2318 01/29/20 0516  WBC 13.4* 9.3  NEUTROABS 12.4*  --   HGB 14.7 14.4  HCT 42.4 41.4  MCV 93.0 92.0  PLT 228 218   Cardiac Enzymes: Recent Labs  Lab 01/29/20 0516  CKTOTAL 25*   BNP: Invalid input(s): POCBNP CBG: No results for input(s): GLUCAP in the last 168 hours. D-Dimer No results for input(s): DDIMER in the last 72 hours. Hgb A1c No results for input(s): HGBA1C in the last 72 hours. Lipid Profile No results for input(s): CHOL, HDL, LDLCALC, TRIG, CHOLHDL, LDLDIRECT in the last 72 hours. Thyroid function studies No results for input(s): TSH, T4TOTAL, T3FREE, THYROIDAB in the last 72 hours.  Invalid input(s): FREET3 Anemia work up No results for input(s): VITAMINB12, FOLATE, FERRITIN, TIBC, IRON, RETICCTPCT in the last 72 hours. Urinalysis No results found for: COLORURINE, APPEARANCEUR, LABSPEC, PHURINE, GLUCOSEU, HGBUR, BILIRUBINUR, KETONESUR, PROTEINUR, UROBILINOGEN, NITRITE, LEUKOCYTESUR Sepsis Labs Invalid input(s): PROCALCITONIN,  WBC,  LACTICIDVEN Microbiology Recent Results (from the past 240 hour(s))  Resp Panel by RT-PCR (Flu A&B, Covid) Nasopharyngeal Swab     Status:  None   Collection Time: 01/29/20 12:29 AM   Specimen: Nasopharyngeal Swab; Nasopharyngeal(NP) swabs in vial transport medium  Result Value Ref Range Status   SARS Coronavirus 2 by RT PCR NEGATIVE NEGATIVE Final    Comment: (NOTE) SARS-CoV-2 target nucleic acids are NOT DETECTED.  The SARS-CoV-2 RNA is generally detectable in upper respiratory specimens during the acute phase of infection. The lowest concentration of SARS-CoV-2 viral copies this assay can detect is 138 copies/mL. A negative result does not preclude SARS-Cov-2 infection and should not be used as the sole basis for treatment or other patient management decisions. A negative result may occur with  improper specimen collection/handling, submission of specimen other than nasopharyngeal swab, presence of viral mutation(s) within the areas targeted by this assay, and inadequate number of viral copies(<138 copies/mL). A negative result must be combined with clinical observations, patient history, and epidemiological information. The expected result is Negative.  Fact Sheet for Patients:  BloggerCourse.com  Fact Sheet for Healthcare Providers:  SeriousBroker.it  This test is no t yet approved or cleared by the [place] FDA and  has been authorized for detection and/or diagnosis of SARS-CoV-2 by FDA under an Emergency  Use Authorization (EUA). This EUA will remain  in effect (meaning this test can be used) for the duration of the COVID-19 declaration under Section 564(b)(1) of the Act, 21 U.S.C.section 360bbb-3(b)(1), unless the authorization is terminated  or revoked sooner.       Influenza A by PCR NEGATIVE NEGATIVE Final   Influenza B by PCR NEGATIVE NEGATIVE Final    Comment: (NOTE) The Xpert Xpress SARS-CoV-2/FLU/RSV plus assay is intended as an aid in the diagnosis of influenza from Nasopharyngeal swab specimens and should not be used as a sole basis for  treatment. Nasal washings and aspirates are unacceptable for Xpert Xpress SARS-CoV-2/FLU/RSV testing.  Fact Sheet for Patients: BloggerCourse.com  Fact Sheet for Healthcare Providers: SeriousBroker.it  This test is not yet approved or cleared by the [place] FDA and has been authorized for detection and/or diagnosis of SARS-CoV-2 by FDA under an Emergency Use Authorization (EUA). This EUA will remain in effect (meaning this test can be used) for the duration of the COVID-19 declaration under Section 564(b)(1) of the Act, 21 U.S.C. section 360bbb-3(b)(1), unless the authorization is terminated or revoked.  Performed at [facility], [address], [place], [place] [address]      SIGNED:   Jacquelin [facility], MD [facility] 01/30/2020, 8:12 AM

## 2020-01-30 NOTE — Plan of Care (Signed)
  Problem: Education: Goal: Knowledge of General Education information will improve Description: Including pain rating scale, medication(s)/side effects and non-pharmacologic comfort measures 01/30/2020 1247 by Karolee Ohs, RN Outcome: Adequate for Discharge 01/30/2020 0857 by Karolee Ohs, RN Outcome: Progressing   Problem: Health Behavior/Discharge Planning: Goal: Ability to manage health-related needs will improve 01/30/2020 1247 by Karolee Ohs, RN Outcome: Adequate for Discharge 01/30/2020 0857 by Karolee Ohs, RN Outcome: Progressing   Problem: Clinical Measurements: Goal: Ability to maintain clinical measurements within normal limits will improve Outcome: Adequate for Discharge Goal: Will remain free from infection Outcome: Adequate for Discharge Goal: Diagnostic test results will improve Outcome: Adequate for Discharge Goal: Respiratory complications will improve Outcome: Adequate for Discharge Goal: Cardiovascular complication will be avoided Outcome: Adequate for Discharge   Problem: Activity: Goal: Risk for activity intolerance will decrease Outcome: Adequate for Discharge   Problem: Nutrition: Goal: Adequate nutrition will be maintained Outcome: Adequate for Discharge   Problem: Coping: Goal: Level of anxiety will decrease Outcome: Adequate for Discharge   Problem: Elimination: Goal: Will not experience complications related to bowel motility Outcome: Adequate for Discharge Goal: Will not experience complications related to urinary retention Outcome: Adequate for Discharge   Problem: Pain Managment: Goal: General experience of comfort will improve Outcome: Adequate for Discharge   Problem: Safety: Goal: Ability to remain free from injury will improve Outcome: Adequate for Discharge   Problem: Skin Integrity: Goal: Risk for impaired skin integrity will decrease Outcome: Adequate for Discharge

## 2020-01-30 NOTE — Evaluation (Signed)
Physical Therapy Evaluation Patient Details Name: Daniel Walter MRN: [id number] DOB: Aug 11, 1955 Today's Date: 01/30/2020   History of Present Illness  Daniel Walter is a 64 y.o. male with medical history significant for Anterolateral MI, CAD S/P PTCA and stenting of Ost LAD to Prox LAD with a Xience Alpine Rx 3.5 x 23 DES; stenosis reduced from 99% to 0% (2017), HTN, dairy and gluten intolerance who presents to the emergency room due to sudden loss of consciousness at home.  Patient states that he felt very tired all day today and has had minimal abdominal cramping.  Patient states that he took his BP meds around 6:30 PM to 7 PM and around 9 PM of 11/26, he stood up to go use the restroom, but he fainted for less than a minute (per wife at bedside), patient did not have any postictal state and denies biting of tongue or lips.  He did not sustain any traumatic injury except for a slight bruise on lateral right side of the chest.  On second attempt of standing up, he fainted again and this time, he had a large nonbloody bowel movement.  There was still no postictal state and the episode only lasted a few seconds.  Patient denies chest pain, shortness of breath, fever, chills.  EMS was activated and patient was taken to the ED for further evaluation and management.   Clinical Impression   Patient sitting up in bed at start of session with family present. Very agreeable to therapy with reports of baseline sinus symptoms (has been going on for years). Independent in all bed mobilities, transfers and walking with no change in baseline symptoms. Educated patient on current functional status, left bed alarm off and notified nurse of functional mobility, no need for PT at this time and leaving bed alarm arm. Patient discharged from physical therapy to nursing staff for ambulation as tolerated daily for length of stay.    Follow Up Recommendations No PT follow up    Equipment Recommendations  None  recommended by PT    Recommendations for Other Services       Precautions / Restrictions        Mobility  Bed Mobility Overal bed mobility: Independent                  Transfers Overall transfer level: Independent                  Ambulation/Gait Ambulation/Gait assistance: Independent Gait Distance (Feet): 150 Feet Assistive device: None Gait Pattern/deviations: WFL(Within Functional Limits) Gait velocity: WFL   General Gait Details: ambulates with no deviations or change in baseline sinus symptoms  Stairs            Wheelchair Mobility    Modified Rankin (Stroke Patients Only)       Balance Overall balance assessment: Independent                                           Pertinent Vitals/Pain Pain Assessment: No/denies pain    Home Living Family/patient expects to be discharged to:: Private residence Living Arrangements: Spouse/significant other Available Help at Discharge: Family Type of Home: House Home Access: Level entry     Home Layout: One level Home Equipment: None      Prior Function Level of Independence: Independent  Hand Dominance        Extremity/Trunk Assessment        Lower Extremity Assessment Lower Extremity Assessment: Overall WFL for tasks assessed       Communication   Communication: No difficulties  Cognition Arousal/Alertness: Awake/alert Behavior During Therapy: WFL for tasks assessed/performed                                          General Comments General comments (skin integrity, edema, etc.): good standing and sitting balance with no upper extremity support    Exercises General Exercises - Lower Extremity Hip Flexion/Marching: AROM;Both;15 reps;Standing   Assessment/Plan    PT Assessment Patent does not need any further PT services  PT Problem List         PT Treatment Interventions      PT Goals (Current goals can be  found in the Care Plan section)  Acute Rehab PT Goals Patient Stated Goal: to go home PT Goal Formulation: With patient/family Time For Goal Achievement: 02/13/20 Potential to Achieve Goals: Good    Frequency     Barriers to discharge        Co-evaluation               AM-PAC PT "6 Clicks" Mobility  Outcome Measure Help needed turning from your back to your side while in a flat bed without using bedrails?: None Help needed moving from lying on your back to sitting on the side of a flat bed without using bedrails?: None Help needed moving to and from a bed to a chair (including a wheelchair)?: None Help needed standing up from a chair using your arms (e.g., wheelchair or bedside chair)?: None Help needed to walk in hospital room?: None Help needed climbing 3-5 steps with a railing? : None 6 Click Score: 24    End of Session Equipment Utilized During Treatment: Gait belt Activity Tolerance: Patient tolerated treatment well;No increased pain Patient left: in bed;with call bell/phone within reach;with family/visitor present Nurse Communication: Mobility status;Precautions      Time: 1005-1013 PT Time Calculation (min) (ACUTE ONLY): 8 min   Charges:              11:19 AM, 01/30/20 Tereasa Coop, DPT Physical Therapy with [facility]  [phone] office

## 2020-01-30 NOTE — Plan of Care (Signed)
  Problem: Education: Goal: Knowledge of General Education information will improve Description Including pain rating scale, medication(s)/side effects and non-pharmacologic comfort measures Outcome: Progressing   Problem: Health Behavior/Discharge Planning: Goal: Ability to manage health-related needs will improve Outcome: Progressing   

## 2020-02-22 NOTE — Progress Notes (Signed)
Primary Physician:  Jarrett Soho, PA-C   Patient ID: Daniel Walter, male    DOB: [birth year], 64 y.o.   MRN: [id number]  Subjective:    No chief complaint on file.   HPI: Daniel Walter  is a 64 y.o. male  with past medical history of CAD S/P  stenting with a Xience Alpine 3.5 x 23 mm DES. In 2013, diet controlled diabetes, hypertension, hyperlipidemia, GERD, and erectile dysfunction, severe allergy to milk and mild products. He was evaluated by allergist, but was not found to have dairy allergy but potential intolerance.   He has had myalgia symptoms to several statins in the past and multiple medication intolerances. Ideally he is a good candidate for PCSK9 inhibitor; however, cost has been an issue.   Patient was last seen in our office 09/30/2019 by Dr. Jacinto Halim.  He then presented to [facility] emergency department 01/29/2020 for evaluation of 2 syncopal episodes that were determined likely related to orthostatic hypotension.  Upon discharge patient was instructed to hold telmisartan and spironolactone, continue Coreg and aspirin.  Patient now presents for follow-up after hospital discharge on 01/30/2020. Patient reports on 01/29/2020 he was not feeling well throughout the day. He was then walking and felt dizzy and hot, and became diaphoretic, he then lost consciousness for <1 minute according to his wife who is present today and also witnessed the syncopal episode. During syncopal episode patient with bowel and bladder incontinence. Of note patient reports he was not well hydrated at the time of the event. EMS was called who noted patient's blood pressure to be low and started fluid resuscitation. Patient was admitted to the hospital and worked showed orthostasis, but was negative for cardiac arrhythmia on telemetry.  Since discharge patient states he has been feeling well, without recurrence of syncope.  Denies chest pain, palpitations, dyspnea, leg swelling, dizziness.  He does report  that since stopping telmisartan and spironolactone and reducing his carvedilol his home blood pressure readings have slowly been increasing since discharge.  Patient discussed at length regarding concerns about sinus pressure and anxiety. He has also made significant changes to his diet including eliminating gluten and dairy.   Past Medical History:  Diagnosis Date  . CAD (coronary artery disease)   . Coronary artery disease   . ED (erectile dysfunction)   . GERD (gastroesophageal reflux disease)   . Hypertension   . MI (myocardial infarction) (HCC)   . Myalgia due to statin     Past Surgical History:  Procedure Laterality Date  . APPENDECTOMY    . CARDIAC CATHETERIZATION N/A 10/02/2015   Procedure: Left Heart Cath and Coronary Angiography;  Surgeon: Yates Decamp, MD;  Location: [facility] INVASIVE CV LAB;  Service: Cardiovascular;  Laterality: N/A;  . CARDIAC CATHETERIZATION N/A 10/02/2015   Procedure: Coronary Stent Intervention;  Surgeon: Yates Decamp, MD;  Location: [facility] INVASIVE CV LAB;  Service: Cardiovascular;  Laterality: N/A;  Prox LAD  . CHOLECYSTECTOMY    . CORONARY ANGIOPLASTY    . KNEE ARTHROSCOPY     Social History   Tobacco Use  . Smoking status: Never Smoker  . Smokeless tobacco: Never Used  Substance Use Topics  . Alcohol use: Yes    Comment: occ   Marital Status: Married   Review of Systems  Constitutional: Negative for weight gain.  Cardiovascular: Negative for chest pain, claudication, dyspnea on exertion, leg swelling, near-syncope, orthopnea, palpitations, paroxysmal nocturnal dyspnea and syncope.  Respiratory: Negative for shortness of breath.  Hematologic/Lymphatic: Does not bruise/bleed easily.  Musculoskeletal: Positive for arthritis (right shoulder), back pain, muscle cramps and neck pain.  Gastrointestinal: Positive for bloating. Negative for melena.  Neurological: Negative for dizziness and weakness.  Psychiatric/Behavioral: The patient is nervous/anxious.     Objective:  There were no vitals taken for this visit. There is no height or weight on file to calculate BMI.  Vitals with BMI 01/30/2020 01/30/2020 01/29/2020  Height - - -  Weight - - -  BMI - - -  Systolic 103 99 113  Diastolic 69 65 80  Pulse 89 83 80      Physical Exam Vitals reviewed.  Constitutional:      General: He is not in acute distress.    Appearance: He is well-developed.  HENT:     Head: Normocephalic and atraumatic.  Cardiovascular:     Rate and Rhythm: Normal rate and regular rhythm.     Pulses: Intact distal pulses.          Carotid pulses are 2+ on the right side and 2+ on the left side.      Radial pulses are 2+ on the right side and 2+ on the left side.       Femoral pulses are 2+ on the right side and 2+ on the left side.      Popliteal pulses are 2+ on the right side and 2+ on the left side.       Dorsalis pedis pulses are 1+ on the right side and 1+ on the left side.       Posterior tibial pulses are 1+ on the right side and 1+ on the left side.     Heart sounds: Normal heart sounds, S1 normal and S2 normal. No murmur heard. No gallop.      Comments: No JVD. No pedal edema Pulmonary:     Effort: Pulmonary effort is normal. No accessory muscle usage or respiratory distress.     Breath sounds: Normal breath sounds. No wheezing, rhonchi or rales.  Abdominal:     General: Bowel sounds are normal.     Palpations: Abdomen is soft.  Musculoskeletal:     Right lower leg: No edema.     Left lower leg: No edema.  Neurological:     Mental Status: He is alert.    Laboratory examination:    CMP Latest Ref Rng & Units 01/29/2020 01/28/2020 07/06/2019  Glucose 70 - 99 mg/dL 076(K) 088(P) 103(P)  BUN 8 - 23 mg/dL 19 59(Y) 11  Creatinine 0.61 - 1.24 mg/dL 5.85 9.29 2.44  Sodium 135 - 145 mmol/L 133(L) 132(L) 141  Potassium 3.5 - 5.1 mmol/L 3.8 4.8 4.9  Chloride 98 - 111 mmol/L 105 103 104  CO2 22 - 32 mmol/L 20(L) 21(L) 26  Calcium 8.9 - 10.3 mg/dL  8.1(L) 8.0(L) 9.8  Total Protein 6.5 - 8.1 g/dL 6.2(M) 6.0(L) 6.7  Total Bilirubin 0.3 - 1.2 mg/dL 0.8 0.7 0.7  Alkaline Phos 38 - 126 U/L 54 59 79  AST 15 - 41 U/L 17 16 13   ALT 0 - 44 U/L 16 17 12    CBC Latest Ref Rng & Units 01/29/2020 01/28/2020 06/06/2018  WBC 4.0 - 10.5 K/uL 9.3 13.4(H) 9.0  Hemoglobin 13.0 - 17.0 g/dL 01/30/2020 08/06/2018 63.8  Hematocrit 39.0 - 52.0 % 41.4 42.4 43.8  Platelets 150 - 400 K/uL 218 228 275   Lipid Panel     Component Value Date/Time   CHOL 151 07/06/2019 1121  TRIG 171 (H) 07/06/2019 1121   HDL 41 07/06/2019 1121   CHOLHDL 3.4 10/02/2015 1834   VLDL 9 10/02/2015 1834   LDLCALC 81 07/06/2019 1121   HEMOGLOBIN A1C No results found for: HGBA1C, MPG TSH No results for input(s): TSH in the last 8760 hours.  External labs: 02/07/2020: BUN 9.0, creatinine 0.80  11/15/2019: HDL 41, LDL 82, total cholesterol 622, triglycerides 159 A1c 6.5%  01/05/2019: Total cholesterol 154, triglycerides 134, HDL 39, LDL 89.  Non-HDL cholesterol 115.   Allergies/Medications:   Allergies  Allergen Reactions  . Gluten Meal   . Milk-Related Compounds Diarrhea and Other (See Comments)    NO DAIRY; LETHARGY (ALSO)  . Msud Aid [Alitraq]     No outpatient medications have been marked as taking for the 02/23/20 encounter (Appointment) with Carlynn Purl, Katai Marsico C, PA-C.    Radiology  No results found.   Cardiac Studies:   Bilateral Carotid Duplex 01/29/2020:  Minimal amount of bilateral intimal thickening/atherosclerotic plaque, left subjectively greater than right, not resulting in a hemodynamically significant stenosis within either internal carotid artery.  Echocardiogram 01/29/2020:  1. Left ventricular ejection fraction, by estimation, is 65 to 70%. The left ventricle has normal function. The left ventricle has no regional wall motion abnormalities. There is mild left ventricular hypertrophy. Left ventricular diastolic parameters are consistent with Grade I  diastolic dysfunction (impaired relaxation).  2. Right ventricular systolic function is normal. The right ventricular size is normal. Tricuspid regurgitation signal is inadequate for assessing PA pressure.  3. The mitral valve is grossly normal. Trivial mitral valve regurgitation.  4. The aortic valve is tricuspid. Aortic valve regurgitation is not visualized.  5. The inferior vena cava is normal in size with greater than 50% respiratory variability, suggesting right atrial pressure of 3 mmHg.  6. Cannot exclude PFO.  Echocardiogram 11/07/2015: Left ventricle cavity is normal in size. Mild concentric hypertrophy of the left ventricle. Normal diastolic filling pattern. Left ventricle regional wall motion findings: Apical anterior, Apical septal and Apical mild hypokinesis. Visual EF is 50-55%. Trace mitral regurgitation. Mild tricuspid regurgitation. No evidence of pulmonary hypertension. Compared to the echocardiogram 10/03/2015, EF improved from 30-35 percent.  Coronary angiogram 10/02/2015: 1. Ost LAD to Prox LAD lesion, 99 %stenosed. XIENCE ALPINE RX [id number] drug eluting stent. Post intervention, there is a 0% residual stenosis. TIMI flow improved from TIMI 1 to TIMI 3 at end of the procedure. Otherwise normal coronary arteries. 2. There is severe left ventricular systolic dysfunction. The left ventricular ejection fraction is less than 25% by visual estimate.   EKG   EKG 02/23/2020: Sinus rhythm at a rate of 76 bpm.  Normal axis.  No evidence of ischemia. Compared to EKG 06/21/2019, no PRWP noted.   EKG 06/21/2019: Normal sinus rhythm with rate of 73 bpm, normal axis.  Poor R wave progression, cannot exclude anteroseptal infarct old.  Normal QT interval.  No evidence of ischemia. Lov voltage.  No significant change from EKG 12/21/2018.  Assessment:     ICD-10-CM   1. Syncope and collapse  R55   2. Coronary artery disease involving native coronary artery of native heart without angina  pectoris  I25.10   3. Essential hypertension  I10   4. Hypercholesteremia  E78.00     No orders of the defined types were placed in this encounter.  There are no discontinued medications.  Recommendations:   GURLEY CLIMER  is a 64 y.o.  male  with past medical history of CAD S/P  stenting with a Xience Alpine 3.5 x 23 mm DES. In 2013, diet controlled diabetes, hypertension, hyperlipidemia, GERD, and erectile dysfunction, severe allergy to milk and mild products. He was evaluated by allergist, but was not found to have dairy allergy but potential intolerance.   He has had myalgia symptoms to several statins in the past and multiple medication intolerances. Ideally he is a good candidate for PCSK9 inhibitor; however, cost has been an issue. Patient has also been unable to tolerate red yeast rice due to GI issues.   Patient presents for follow-up after discharge from the hospital where he was admitted 01/29/2020 for syncope. Patient reports episode of syncope suggestive of orthostatic vs vasovagal syncope. Further workup is no clinically indicated at this time, however counseled patient regarding signs and symptoms that would warrant urgent evaluation.  In regard to blood pressure, patient is not orthostatic today.  In fact his blood pressure significantly elevated above goal.  Shared decision-making was used to determine the patient will restart telmisartan 80 mg daily in view of his history of CAD, diabetes.  Obtain BMP in 1 week.   Recent echocardiogram in the hospital did reveal diastolic dysfunction, therefore patient would likely benefit from spironolactone in the future if he needs further blood pressure control.  In regards to hyperlipidemia, LDL is still above goal at 82 at last check.  However patient has been unable to tolerate several statins and red yeast rice in the past.  Could consider PCSK9 inhibitor, will discuss at next visit as patient has appointment to establish care with a new  PCP who will likely be doing labs.  Follow up in 6 weeks for hypertension.   This was a 40-minute encounter with face-to-face counseling, medical records review, coordination of care, explanation of complex medical issues, complex medical decision making, discussion of patient's dietary changes.     Daniel Halsted, PA-C 02/23/2020, 4:11 PM Office: [phone]

## 2020-02-23 ENCOUNTER — Encounter: Payer: Self-pay | Admitting: Student

## 2020-02-23 ENCOUNTER — Ambulatory Visit: Payer: 59 | Admitting: Student

## 2020-02-23 ENCOUNTER — Other Ambulatory Visit: Payer: Self-pay

## 2020-02-23 VITALS — BP 174/107 | HR 72 | Resp 16 | Ht 70.0 in

## 2020-02-23 DIAGNOSIS — I119 Hypertensive heart disease without heart failure: Secondary | ICD-10-CM

## 2020-02-23 DIAGNOSIS — I2510 Atherosclerotic heart disease of native coronary artery without angina pectoris: Secondary | ICD-10-CM

## 2020-02-23 DIAGNOSIS — I10 Essential (primary) hypertension: Secondary | ICD-10-CM

## 2020-02-23 DIAGNOSIS — E7800 Pure hypercholesterolemia, unspecified: Secondary | ICD-10-CM

## 2020-02-23 DIAGNOSIS — R55 Syncope and collapse: Secondary | ICD-10-CM

## 2020-02-23 MED ORDER — TELMISARTAN 80 MG PO TABS: 80.0000 mg | ORAL_TABLET | Freq: Every day | ORAL | 3 refills | Status: DC

## 2020-02-23 NOTE — Addendum Note (Signed)
Addended by: Trula Ore on: 02/23/2020 04:50 PM   Modules accepted: Orders

## 2020-03-03 LAB — BASIC METABOLIC PANEL
BUN/Creatinine Ratio: 8 — ABNORMAL LOW (ref 10–24)
BUN: 7 mg/dL — ABNORMAL LOW (ref 8–27)
CO2: 27 mmol/L (ref 20–29)
Calcium: 10 mg/dL (ref 8.6–10.2)
Chloride: 103 mmol/L (ref 96–106)
Creatinine, Ser: 0.88 mg/dL (ref 0.76–1.27)
GFR calc Af Amer: 106 mL/min/{1.73_m2} (ref >59)
GFR calc non Af Amer: 92 mL/min/{1.73_m2} (ref >59)
Glucose: 190 mg/dL — ABNORMAL HIGH (ref 65–99)
Potassium: 4.8 mmol/L (ref 3.5–5.2)
Sodium: 142 mmol/L (ref 134–144)

## 2020-03-04 NOTE — Progress Notes (Signed)
Please inform patient his kidney function is stable since restarting the telmisartan. Please continue the medication.

## 2020-03-06 NOTE — Progress Notes (Signed)
Called and spoke with patient regarding his lab results.

## 2020-03-13 ENCOUNTER — Other Ambulatory Visit: Payer: Self-pay | Admitting: Cardiology

## 2020-03-13 DIAGNOSIS — I10 Essential (primary) hypertension: Secondary | ICD-10-CM

## 2020-03-13 MED ORDER — CARVEDILOL 6.25 MG PO TABS: 6.2500 mg | ORAL_TABLET | Freq: Two times a day (BID) | ORAL | 1 refills | Status: DC

## 2020-04-04 NOTE — Progress Notes (Signed)
Primary Physician:  Jarrett Soho, PA-C   Patient ID: Daniel Walter, male    DOB: 1955/12/10, 65 y.o.   MRN: [id number]  Subjective:    Chief Complaint  Patient presents with  . Hypertension    6 weeks    HPI: Daniel Walter  is a 65 y.o. male  with past medical history of CAD S/P  stenting with a Xience Alpine 3.5 x 23 mm DES. In 2013, diet controlled diabetes, hypertension, hyperlipidemia, GERD, and erectile dysfunction, severe allergy to milk and mild products. He was evaluated by allergist, but was not found to have dairy allergy but potential intolerance. Patient has eliminated dairy, gluten, yeast from his diet and has noticed improvement of sinus pressure and anxiety symptoms. Patient has a history of syncopal episode 01/29/2020, likely related to orthostatic hypotension.  He has had myalgia symptoms to several statins in the past and multiple medication intolerances. Ideally he is a good candidate for PCSK9 inhibitor, however in the past cost has been a bit of issue.  Patient presents for 6 week follow up of hypertension. At last visit restarted telmisartan 80 mg daily. Patient reports he is tolerating telmisartan well without issue. Since our last visit he has been seen by [facility] who is assisting patient modifying his diet to eliminate gluten and he, which provider suspect has been contributing to his anxiety. Overall patient is feeling much improved since last visit. He brings with him a written log of home blood pressure readings which are under excellent control at 115-125/80s mmHg. patient denies chest pain, palpitations, dyspnea, leg swelling, dizziness. He has had no recurrence of syncope or near syncope.  Past Medical History:  Diagnosis Date  . CAD (coronary artery disease)   . Coronary artery disease   . ED (erectile dysfunction)   . GERD (gastroesophageal reflux disease)   . Hypertension   . MI (myocardial infarction) (HCC)   . Myalgia due to  statin     Past Surgical History:  Procedure Laterality Date  . APPENDECTOMY    . CARDIAC CATHETERIZATION N/A 10/02/2015   Procedure: Left Heart Cath and Coronary Angiography;  Surgeon: Yates Decamp, MD;  Location: [facility] INVASIVE CV LAB;  Service: Cardiovascular;  Laterality: N/A;  . CARDIAC CATHETERIZATION N/A 10/02/2015   Procedure: Coronary Stent Intervention;  Surgeon: Yates Decamp, MD;  Location: [facility] INVASIVE CV LAB;  Service: Cardiovascular;  Laterality: N/A;  Prox LAD  . CHOLECYSTECTOMY    . CORONARY ANGIOPLASTY    . KNEE ARTHROSCOPY     Social History   Tobacco Use  . Smoking status: Never Smoker  . Smokeless tobacco: Never Used  Substance Use Topics  . Alcohol use: Not Currently    Comment: occ   Marital Status: Married   Review of Systems  Constitutional: Negative for weight gain.  Cardiovascular: Negative for chest pain, claudication, dyspnea on exertion, leg swelling, near-syncope, orthopnea, palpitations, paroxysmal nocturnal dyspnea and syncope.  Respiratory: Negative for shortness of breath.   Hematologic/Lymphatic: Does not bruise/bleed easily.  Musculoskeletal: Positive for arthritis (right shoulder). Negative for back pain, muscle cramps and neck pain.  Gastrointestinal: Negative for bloating and melena.  Neurological: Negative for dizziness and weakness.  Psychiatric/Behavioral: The patient is nervous/anxious.    Objective:  Blood pressure 124/84, pulse 85, temperature 98 F (36.7 C), temperature source Temporal, resp. rate 16, height 5\' 10"  (1.778 m), weight 190 lb 3.2 oz (86.3 kg), SpO2 99 %. Body mass index is 27.29 kg/m.  Vitals  with BMI 04/05/2020 02/23/2020 01/30/2020  Height 5\' 10"  5\' 10"  -  Weight 190 lbs 3 oz - -  BMI 27.29 - -  Systolic 124 174  Diastolic 84 107 69  Pulse 85 72 89      Physical Exam Vitals reviewed.  Constitutional:      General: He is not in acute distress.    Appearance: He is well-developed.  HENT:     Head: Normocephalic and  atraumatic.  Cardiovascular:     Rate and Rhythm: Normal rate and regular rhythm.     Pulses: Intact distal pulses.     Heart sounds: Normal heart sounds, S1 normal and S2 normal. No murmur heard. No gallop.      Comments: No JVD. No pedal edema Pulmonary:     Effort: Pulmonary effort is normal. No accessory muscle usage or respiratory distress.     Breath sounds: Normal breath sounds. No wheezing, rhonchi or rales.  Abdominal:     General: Bowel sounds are normal.     Palpations: Abdomen is soft.  Musculoskeletal:     Right lower leg: No edema.     Left lower leg: No edema.  Neurological:     Mental Status: He is alert.    Laboratory examination:    CMP Latest Ref Rng & Units 03/02/2020 01/29/2020 01/28/2020  Glucose 65 - 99 mg/dL 01/31/2020) 01/30/2020) 481(E)  BUN 8 - 27 mg/dL 7(L) 19 563(J)  Creatinine 0.76 - 1.27 mg/dL 497(W 26(V 7.85  Sodium 134 - 144 mmol/L 142 133(L) 132(L)  Potassium 3.5 - 5.2 mmol/L 4.8 3.8 4.8  Chloride 96 - 106 mmol/L 103 105 103  CO2 20 - 29 mmol/L 27 20(L) 21(L)  Calcium 8.6 - 10.2 mg/dL 8.85 0.27) 74.1)  Total Protein 6.5 - 8.1 g/dL - 5.8(L) 6.0(L)  Total Bilirubin 0.3 - 1.2 mg/dL - 0.8 0.7  Alkaline Phos 38 - 126 U/L - 54 59  AST 15 - 41 U/L - 17 16  ALT 0 - 44 U/L - 16 17   CBC Latest Ref Rng & Units 01/29/2020 01/28/2020 06/06/2018  WBC 4.0 - 10.5 K/uL 9.3 13.4(H) 9.0  Hemoglobin 13.0 - 17.0 g/dL 01/30/2020 08/06/2018 67.2  Hematocrit 39.0 - 52.0 % 41.4 42.4 43.8  Platelets 150 - 400 K/uL 218 228 275   Lipid Panel     Component Value Date/Time   CHOL 151 07/06/2019 1121   TRIG 171 (H) 07/06/2019 1121   HDL 41 07/06/2019 1121   CHOLHDL 3.4 10/02/2015 1834   VLDL 9 10/02/2015 1834   LDLCALC 81 07/06/2019 1121   HEMOGLOBIN A1C No results found for: HGBA1C, MPG TSH No results for input(s): TSH in the last 8760 hours.  External labs: 03/17/2020: Hemoglobin 19.8, hematocrit 58.0, MCV 94, platelets 214 Glucose 215, BUN nine, creatinine 0.86, GFR 93,  sodium 141, potassium 4.4, BMP otherwise normal A1c 6.2% TSH 3.13, free T4 1.2  02/07/2020: BUN 9.0, creatinine 0.80  11/15/2019: HDL 41, LDL 82, total cholesterol 14/08/2019, triglycerides 159 A1c 6.5%  01/05/2019: Total cholesterol 154, triglycerides 134, HDL 39, LDL 89.  Non-HDL cholesterol 115.   Allergies/Medications:   Allergies  Allergen Reactions  . Gluten Meal   . Milk-Related Compounds Diarrhea and Other (See Comments)    NO DAIRY; LETHARGY (ALSO)  . Msud Aid [Alitraq]     Current Meds  Medication Sig  . Ascorbic Acid (VITAMIN C) 500 MG CAPS Take 500 mg by mouth in the morning and at bedtime.  628  aspirin 81 MG chewable tablet Chew 1 tablet (81 mg total) by mouth daily.  . carvedilol (COREG) 6.25 MG tablet Take 1 tablet (6.25 mg total) by mouth 2 (two) times daily with a meal.  . Cholecalciferol (VITAMIN D3) 5000 units CAPS Take 5,000 Units by mouth daily.   . Digestive Enzymes (DIGESTIVE ENZYME PO) Take 350 mg by mouth daily. Enzyme aid digestive, Natures life  . NON FORMULARY 10 drops daily. Argentyn 23- immune support  . Probiotic Product (PROBIOTIC ADVANCED PO) Take 340 mg by mouth in the morning and at bedtime. Mega Sporebiotic  . sildenafil (REVATIO) 20 MG tablet TAKE 1 TABLET BY MOUTH AS NEEDED (Patient taking differently: Take 20 mg by mouth daily as needed (intimate moments).)  . telmisartan (MICARDIS) 80 MG tablet Take 1 tablet (80 mg total) by mouth daily.  . Zinc 50 MG CAPS Take by mouth daily.    Radiology  No results found.   Cardiac Studies:   Bilateral Carotid Duplex 01/29/2020:  Minimal amount of bilateral intimal thickening/atherosclerotic plaque, left subjectively greater than right, not resulting in a hemodynamically significant stenosis within either internal carotid artery.  Echocardiogram 01/29/2020:  1. Left ventricular ejection fraction, by estimation, is 65 to 70%. The left ventricle has normal function. The left ventricle has no regional wall  motion abnormalities. There is mild left ventricular hypertrophy. Left ventricular diastolic parameters are consistent with Grade I diastolic dysfunction (impaired relaxation).  2. Right ventricular systolic function is normal. The right ventricular size is normal. Tricuspid regurgitation signal is inadequate for assessing PA pressure.  3. The mitral valve is grossly normal. Trivial mitral valve regurgitation.  4. The aortic valve is tricuspid. Aortic valve regurgitation is not visualized.  5. The inferior vena cava is normal in size with greater than 50% respiratory variability, suggesting right atrial pressure of 3 mmHg.  6. Cannot exclude PFO.  Echocardiogram 11/07/2015: Left ventricle cavity is normal in size. Mild concentric hypertrophy of the left ventricle. Normal diastolic filling pattern. Left ventricle regional wall motion findings: Apical anterior, Apical septal and Apical mild hypokinesis. Visual EF is 50-55%. Trace mitral regurgitation. Mild tricuspid regurgitation. No evidence of pulmonary hypertension. Compared to the echocardiogram 10/03/2015, EF improved from 30-35 percent.  Coronary angiogram 10/02/2015: 1. Ost LAD to Prox LAD lesion, 99 %stenosed. XIENCE ALPINE RX [id number] drug eluting stent. Post intervention, there is a 0% residual stenosis. TIMI flow improved from TIMI 1 to TIMI 3 at end of the procedure. Otherwise normal coronary arteries. 2. There is severe left ventricular systolic dysfunction. The left ventricular ejection fraction is less than 25% by visual estimate.   EKG   EKG 02/23/2020: Sinus rhythm at a rate of 76 bpm.  Normal axis.  No evidence of ischemia. Compared to EKG 06/21/2019, no PRWP noted.   EKG 06/21/2019: Normal sinus rhythm with rate of 73 bpm, normal axis.  Poor R wave progression, cannot exclude anteroseptal infarct old.  Normal QT interval.  No evidence of ischemia. Lov voltage.  No significant change from EKG 12/21/2018.  Assessment:      ICD-10-CM   1. Essential hypertension  I10   2. Hypercholesteremia  E78.00   3. Coronary artery disease involving native coronary artery of native heart without angina pectoris  I25.10   4. Hypertensive heart disease without heart failure  I11.9     No orders of the defined types were placed in this encounter.  Medications Discontinued During This Encounter  Medication Reason  . NON FORMULARY Patient has  not taken in last 30 days    Recommendations:   Daniel Walter  is a 65 y.o. with past medical history of CAD S/P  stenting with a Xience Alpine 3.5 x 23 mm DES. In 2013, diet controlled diabetes, hypertension, hyperlipidemia, GERD, and erectile dysfunction, severe allergy to milk and mild products. He was evaluated by allergist, but was not found to have dairy allergy but potential intolerance. Patient has eliminated dairy, gluten, yeast from his diet and has noticed improvement of sinus pressure and anxiety symptoms. Patient has a history of syncopal episode 01/29/2020, likely related to orthostatic hypotension.  He has had myalgia symptoms to several statins in the past and multiple medication intolerances. Ideally he is a good candidate for PCSK9 inhibitor, however in the past cost has been a bit of issue. Patient has also been unable to tolerate red yeast rice due to GI issues.   Patient presents for 6-week follow-up of hypertension. Since restarting telmisartan 80 mg daily, patient's blood pressure has been well controlled. Notably blood pressure was initially elevated in the office today, upon recheck it had improved and home readings are under excellent control. We will continue carvedilol, telmisartan, aspirin. Patient is not presently on a statin as he has been unable to tolerate several statins in the past due to myalgia. Discussed with patient regarding initiation of PCSK9 inhibitor as LDL remains >70. Despite discussion of benefits regarding addition of PCSK9 and strict lipid  control, patient reports he would prefer to hold off on additional medications at this point and first try continued diet modifications. Recommend rechecking lipid profile in 3 months. If LDL remains >70, could consider addition of PCSK9 inhibitor.  Follow up in 6 months, sooner if needed, for hypertension, CAD, and hyperlipidemia.    Rayford Halsted, PA-C 04/05/2020, 11:30 AM Office: [phone]

## 2020-04-05 ENCOUNTER — Other Ambulatory Visit: Payer: Self-pay

## 2020-04-05 ENCOUNTER — Ambulatory Visit: Payer: 59 | Admitting: Student

## 2020-04-05 ENCOUNTER — Encounter: Payer: Self-pay | Admitting: Student

## 2020-04-05 VITALS — BP 124/84 | HR 85 | Temp 98.0°F | Resp 16 | Ht 70.0 in | Wt 190.2 lb

## 2020-04-05 DIAGNOSIS — I2510 Atherosclerotic heart disease of native coronary artery without angina pectoris: Secondary | ICD-10-CM

## 2020-04-05 DIAGNOSIS — I119 Hypertensive heart disease without heart failure: Secondary | ICD-10-CM

## 2020-04-05 DIAGNOSIS — I10 Essential (primary) hypertension: Secondary | ICD-10-CM

## 2020-04-05 DIAGNOSIS — E7800 Pure hypercholesterolemia, unspecified: Secondary | ICD-10-CM

## 2020-05-30 ENCOUNTER — Other Ambulatory Visit: Payer: Self-pay

## 2020-05-30 MED ORDER — SILDENAFIL CITRATE 20 MG PO TABS
20.0000 mg | ORAL_TABLET | ORAL | 0 refills | Status: DC | PRN
Start: 2020-05-30 — End: 2020-10-04

## 2020-07-25 ENCOUNTER — Other Ambulatory Visit: Payer: Self-pay | Admitting: Student

## 2020-07-25 DIAGNOSIS — I10 Essential (primary) hypertension: Secondary | ICD-10-CM

## 2020-08-25 ENCOUNTER — Other Ambulatory Visit: Payer: Self-pay | Admitting: Cardiology

## 2020-08-25 DIAGNOSIS — I10 Essential (primary) hypertension: Secondary | ICD-10-CM

## 2020-09-07 ENCOUNTER — Encounter: Payer: Self-pay | Admitting: Student

## 2020-09-07 ENCOUNTER — Other Ambulatory Visit: Payer: Self-pay

## 2020-09-07 ENCOUNTER — Ambulatory Visit: Payer: 59 | Admitting: Student

## 2020-09-07 VITALS — BP 128/86 | HR 108 | Temp 98.3°F | Resp 16 | Ht 70.0 in | Wt 198.0 lb

## 2020-09-07 DIAGNOSIS — I10 Essential (primary) hypertension: Secondary | ICD-10-CM

## 2020-09-07 DIAGNOSIS — Z01818 Encounter for other preprocedural examination: Secondary | ICD-10-CM

## 2020-09-07 DIAGNOSIS — I119 Hypertensive heart disease without heart failure: Secondary | ICD-10-CM

## 2020-09-07 DIAGNOSIS — I2510 Atherosclerotic heart disease of native coronary artery without angina pectoris: Secondary | ICD-10-CM

## 2020-09-07 DIAGNOSIS — E7800 Pure hypercholesterolemia, unspecified: Secondary | ICD-10-CM

## 2020-09-07 NOTE — Progress Notes (Signed)
[facility]  Primary Physician:  Jarrett Soho, PA-C   Patient ID: Daniel Walter, male    DOB: [birth year], 65 y.o.   MRN: [id number]  Subjective:    Chief Complaint  Patient presents with   Hypertension   Surgical Clearance     HPI: Daniel Walter  is a 65 y.o. male  with past medical history of CAD S/P  stenting with a Xience Alpine 3.5 x 23 mm DES. In 2013, diet controlled diabetes, hypertension, hyperlipidemia, GERD, and erectile dysfunction, severe allergy to milk and mild products. He was evaluated by allergist, but was not found to have dairy allergy but potential intolerance. Patient has eliminated dairy, gluten, yeast from his diet and has noticed improvement of sinus pressure and anxiety symptoms. Patient has a history of syncopal episode 01/29/2020, likely related to orthostatic hypotension.   He has had myalgia symptoms to several statins in the past and multiple medication intolerances. Ideally he is a good candidate for PCSK9 inhibitor, however in the past cost has been a bit of issue.  Patient presents today for preoperative surgical risk stratification for right shoulder arthroscopy with Dr. Duwayne Heck.  Patient has been feeling well since last visit with no specific complaints today.  He continues to follow with [facility] who according to patient has been having blood drawn and labs done every 2 weeks, will request records. Patient denies chest pain, palpitations, dyspnea, leg swelling, dizziness. He has had no recurrence of syncope or near syncope.  Patient reports home heart rate averages 70-80 bpm and blood pressure is under excellent control.  Past Medical History:  Diagnosis Date   CAD (coronary artery disease)    Coronary artery disease    ED (erectile dysfunction)    GERD (gastroesophageal reflux disease)    Hypertension    MI (myocardial infarction) (HCC)    Myalgia due to statin    Past Surgical History:  Procedure Laterality Date    APPENDECTOMY     CARDIAC CATHETERIZATION N/A 10/02/2015   Procedure: Left Heart Cath and Coronary Angiography;  Surgeon: Yates Decamp, MD;  Location: [facility] INVASIVE CV LAB;  Service: Cardiovascular;  Laterality: N/A;   CARDIAC CATHETERIZATION N/A 10/02/2015   Procedure: Coronary Stent Intervention;  Surgeon: Yates Decamp, MD;  Location: [facility] INVASIVE CV LAB;  Service: Cardiovascular;  Laterality: N/A;  Prox LAD   CHOLECYSTECTOMY     CORONARY ANGIOPLASTY     KNEE ARTHROSCOPY     Family History  Problem Relation Age of Onset   Heart attack Mother    Aneurysm Father    Social History   Tobacco Use   Smoking status: Never   Smokeless tobacco: Never  Substance Use Topics   Alcohol use: Not Currently    Comment: occ   Marital Status: Married   ROS:   Review of Systems  Cardiovascular:  Negative for chest pain, claudication, dyspnea on exertion, leg swelling, near-syncope, orthopnea, palpitations, paroxysmal nocturnal dyspnea and syncope.  Respiratory:  Negative for shortness of breath.   Musculoskeletal:  Positive for arthritis (right shoulder).  Neurological:  Negative for dizziness.  Psychiatric/Behavioral:  The patient is nervous/anxious (improving).   Objective:  Blood pressure 128/86, pulse (!) 108, temperature 98.3 F (36.8 C), temperature source Temporal, resp. rate 16, height 5\' 10"  (1.778 m), weight 198 lb (89.8 kg), SpO2 97 %. Body mass index is 28.41 kg/m.  Vitals with BMI 09/07/2020 04/05/2020 02/23/2020  Height 5\' 10"  5\' 10"  5\' 10"   Weight 198 lbs  190 lbs 3 oz -  BMI 28.41 27.29 -  Systolic 128 124 213174  Diastolic 86 84 107  Pulse 108 85 72      Physical Exam Vitals reviewed.  Constitutional:      General: He is not in acute distress.    Appearance: He is well-developed.  Cardiovascular:     Rate and Rhythm: Regular rhythm. Tachycardia present.     Pulses: Intact distal pulses.     Heart sounds: Normal heart sounds, S1 normal and S2 normal. No murmur heard.   No gallop.      Comments: No JVD. No pedal edema Pulmonary:     Effort: Pulmonary effort is normal. No accessory muscle usage.     Breath sounds: Normal breath sounds.  Musculoskeletal:     Right lower leg: No edema.     Left lower leg: No edema.  Neurological:     Mental Status: He is alert.   Laboratory examination:    CMP Latest Ref Rng & Units 03/02/2020 01/29/2020 01/28/2020  Glucose 65 - 99 mg/dL 086(V190(H) 784(O163(H) 962(X219(H)  BUN 8 - 27 mg/dL 7(L) 19 52(W24(H)  Creatinine 0.76 - 1.27 mg/dL 4.130.88 2.440.69 0.100.93  Sodium 134 - 144 mmol/L 142 133(L) 132(L)  Potassium 3.5 - 5.2 mmol/L 4.8 3.8 4.8  Chloride 96 - 106 mmol/L 103 105 103  CO2 20 - 29 mmol/L 27 20(L) 21(L)  Calcium 8.6 - 10.2 mg/dL 27.210.0 5.3(G8.1(L) 6.4(Q8.0(L)  Total Protein 6.5 - 8.1 g/dL - 5.8(L) 6.0(L)  Total Bilirubin 0.3 - 1.2 mg/dL - 0.8 0.7  Alkaline Phos 38 - 126 U/L - 54 59  AST 15 - 41 U/L - 17 16  ALT 0 - 44 U/L - 16 17   CBC Latest Ref Rng & Units 01/29/2020 01/28/2020 06/06/2018  WBC 4.0 - 10.5 K/uL 9.3 13.4(H) 9.0  Hemoglobin 13.0 - 17.0 g/dL 03.414.4 74.214.7 59.515.0  Hematocrit 39.0 - 52.0 % 41.4 42.4 43.8  Platelets 150 - 400 K/uL 218 228 275   Lipid Panel     Component Value Date/Time   CHOL 151 07/06/2019 1121   TRIG 171 (H) 07/06/2019 1121   HDL 41 07/06/2019 1121   CHOLHDL 3.4 10/02/2015 1834   VLDL 9 10/02/2015 1834   LDLCALC 81 07/06/2019 1121   HEMOGLOBIN A1C No results found for: HGBA1C, MPG TSH No results for input(s): TSH in the last 8760 hours.  External labs: 03/17/2020: Hemoglobin 19.8, hematocrit 58.0, MCV 94, platelets 214 Glucose 215, BUN nine, creatinine 0.86, GFR 93, sodium 141, potassium 4.4, BMP otherwise normal A1c 6.2% TSH 3.13, free T4 1.2  02/07/2020: BUN 9.0, creatinine 0.80  11/15/2019: HDL 41, LDL 82, total cholesterol 638150, triglycerides 159 A1c 6.5%  01/05/2019: Total cholesterol 154, triglycerides 134, HDL 39, LDL 89.  Non-HDL cholesterol 115.  Allergies   Allergies  Allergen Reactions   Gluten  Meal     Pt preference    Milk-Related Compounds Diarrhea and Other (See Comments)    NO DAIRY; LETHARGY (ALSO)   Msud Aid [Alitraq]     Unknown    Yeast-Related Products     Blood tests       Medications Prior to Visit:   Outpatient Medications Prior to Visit  Medication Sig Dispense Refill   Ascorbic Acid (VITAMIN C) 250 MG CHEW Take 500 mg by mouth in the morning and at bedtime.     aspirin 81 MG chewable tablet Chew 1 tablet (81 mg total) by mouth daily. 30  tablet 1   Barberry-Oreg Grape-Goldenseal (BERBERINE COMPLEX PO) Take 1 capsule by mouth in the morning and at bedtime.     carvedilol (COREG) 6.25 MG tablet TAKE 1 TABLET 2 TIMES A DAY WITH MEALS (Patient taking differently: Take 6.25 mg by mouth 2 (two) times daily with a meal.) 180 tablet 1   Cholecalciferol (VITAMIN D3) 5000 units CAPS Take 5,000-10,000 Units by mouth daily.     DHEA 25 MG CAPS Take 25 mg by mouth daily.     Digestive Enzymes (DIGESTIVE ENZYME PO) Take 350 mg by mouth daily. Enzyme aid digestive, Natures life     ibuprofen (ADVIL) 200 MG tablet Take 400 mg by mouth every 8 (eight) hours as needed for moderate pain.     OVER THE COUNTER MEDICATION Take 1 tablet by mouth every other day. Methylation Complete     OVER THE COUNTER MEDICATION Take 75 mg by mouth daily. Pregnenolone     Probiotic Product (PROBIOTIC ADVANCED PO) Take 1 capsule by mouth in the morning and at bedtime. Mega Sporebiotic     sildenafil (REVATIO) 20 MG tablet Take 1 tablet (20 mg total) by mouth as needed. (Patient taking differently: Take 20 mg by mouth as needed (ED).) 90 tablet 0   telmisartan (MICARDIS) 80 MG tablet TAKE 1 TABLET DAILY (Patient taking differently: Take 80 mg by mouth daily.) 30 tablet 3   testosterone cypionate (DEPOTESTOSTERONE CYPIONATE) 200 MG/ML injection Inject 0.7 mLs into the muscle once a week.     thyroid (ARMOUR) 60 MG tablet Take 60 mg by mouth daily before breakfast.     Zinc 50 MG CAPS Take 50 mg by mouth  daily.     No facility-administered medications prior to visit.     Final Medications at End of Visit    Current Meds  Medication Sig   Ascorbic Acid (VITAMIN C) 250 MG CHEW Take 500 mg by mouth in the morning and at bedtime.   aspirin 81 MG chewable tablet Chew 1 tablet (81 mg total) by mouth daily.   Barberry-Oreg Grape-Goldenseal (BERBERINE COMPLEX PO) Take 1 capsule by mouth in the morning and at bedtime.   carvedilol (COREG) 6.25 MG tablet TAKE 1 TABLET 2 TIMES A DAY WITH MEALS (Patient taking differently: Take 6.25 mg by mouth 2 (two) times daily with a meal.)   Cholecalciferol (VITAMIN D3) 5000 units CAPS Take 5,000-10,000 Units by mouth daily.   DHEA 25 MG CAPS Take 25 mg by mouth daily.   Digestive Enzymes (DIGESTIVE ENZYME PO) Take 350 mg by mouth daily. Enzyme aid digestive, Natures life   ibuprofen (ADVIL) 200 MG tablet Take 400 mg by mouth every 8 (eight) hours as needed for moderate pain.   OVER THE COUNTER MEDICATION Take 1 tablet by mouth every other day. Methylation Complete   OVER THE COUNTER MEDICATION Take 75 mg by mouth daily. Pregnenolone   Probiotic Product (PROBIOTIC ADVANCED PO) Take 1 capsule by mouth in the morning and at bedtime. Mega Sporebiotic   sildenafil (REVATIO) 20 MG tablet Take 1 tablet (20 mg total) by mouth as needed. (Patient taking differently: Take 20 mg by mouth as needed (ED).)   telmisartan (MICARDIS) 80 MG tablet TAKE 1 TABLET DAILY (Patient taking differently: Take 80 mg by mouth daily.)   testosterone cypionate (DEPOTESTOSTERONE CYPIONATE) 200 MG/ML injection Inject 0.7 mLs into the muscle once a week.   thyroid (ARMOUR) 60 MG tablet Take 60 mg by mouth daily before breakfast.   Zinc 50  MG CAPS Take 50 mg by mouth daily.   Radiology  No results found.   Cardiac Studies:   Bilateral Carotid Duplex 01/29/2020:  Minimal amount of bilateral intimal thickening/atherosclerotic plaque, left subjectively greater than right, not resulting in a  hemodynamically significant stenosis within either internal carotid artery.  Echocardiogram 01/29/2020:   1. Left ventricular ejection fraction, by estimation, is 65 to 70%. The left ventricle has normal function. The left ventricle has no regional wall motion abnormalities. There is mild left ventricular hypertrophy. Left ventricular diastolic parameters are consistent with Grade I diastolic dysfunction (impaired relaxation).   2. Right ventricular systolic function is normal. The right ventricular size is normal. Tricuspid regurgitation signal is inadequate for assessing PA pressure.   3. The mitral valve is grossly normal. Trivial mitral valve regurgitation.   4. The aortic valve is tricuspid. Aortic valve regurgitation is not visualized.   5. The inferior vena cava is normal in size with greater than 50% respiratory variability, suggesting right atrial pressure of 3 mmHg.   6. Cannot exclude PFO.   Coronary angiogram 10/02/2015: 1. Ost LAD to Prox LAD lesion, 99 %stenosed. XIENCE ALPINE RX [id number] drug eluting stent. Post intervention, there is a 0% residual stenosis. TIMI flow improved from TIMI 1 to TIMI 3 at end of the procedure. Otherwise normal coronary arteries. 2. There is severe left ventricular systolic dysfunction. The left ventricular ejection fraction is less than 25% by visual estimate.   EKG  09/07/2020: Sinus rhythm at a rate of 90 bpm.  Left atrial enlargement.  Normal axis.  Nonspecific T wave abnormality.  No significant change compared to EKG 02/23/2020.  06/21/2019: Normal sinus rhythm with rate of 73 bpm, normal axis.  Poor R wave progression, cannot exclude anteroseptal infarct old.  Normal QT interval.  No evidence of ischemia. Lov voltage.  No significant change from EKG 12/21/2018.  Assessment:     ICD-10-CM   1. Encounter for preoperative assessment  Z01.818     2. Essential hypertension  I10     3. Coronary artery disease involving native coronary artery of  native heart without angina pectoris  I25.10     4. Hypercholesteremia  E78.00     5. Hypertensive heart disease without heart failure  I11.9     6. Pre-op evaluation  Z01.818 EKG 12-Lead      No orders of the defined types were placed in this encounter.  There are no discontinued medications.   Recommendations:   Daniel Walter  is a 65 y.o. with past medical history of CAD S/P  stenting with a Xience Alpine 3.5 x 23 mm DES. In 2013, diet controlled diabetes, hypertension, hyperlipidemia, GERD, and erectile dysfunction, severe allergy to milk and mild products. He was evaluated by allergist, but was not found to have dairy allergy but potential intolerance. Patient has eliminated dairy, gluten, yeast from his diet and has noticed improvement of sinus pressure and anxiety symptoms. Patient has a history of syncopal episode 01/29/2020, likely related to orthostatic hypotension.   He has had myalgia symptoms to several statins in the past and multiple medication intolerances. Ideally he is a good candidate for PCSK9 inhibitor, however in the past cost has been a bit of issue. Patient has also been unable to tolerate red yeast rice due to GI issues.   Patient presents today for preoperative surgical risk stratification for right shoulder arthroscopy with Dr. Duwayne Heck.  From a cardiovascular standpoint patient is stable.  Discussed with patient  regarding perioperative cardiovascular risks.  Patient understands secondary to patient of his same age and sex without his cardiovascular history he is at a relatively elevated risk.  However do not feel this risk is prohibitive of proceeding with right shoulder arthroscopy.  We will send surgical restratification form to Dr. Duwayne Heck.  Blood pressure is well controlled.  In regard to hyperlipidemia, patient states [facility] has been checking his cholesterol on a regular basis, will reach out for records.  Again discussed with  patient regarding indication of PCSK9 inhibitor given LDL goal of less than 70 and his history of statin intolerance.  Patient is open to discussion of initiating PCSK9 inhibitors if his LDL remains above goal.  Notably patient's heart rate is mildly elevated today, however patient does admit he is feeling anxious during today's office visit.  His heart rate is well controlled at home.   Follow-up in 6 months, sooner if needed, for hypertension, CAD, and hyperlipidemia.   Rayford Halsted, PA-C 09/07/2020, 2:11 PM Office: [phone]   CC: Dr. Duwayne Heck

## 2020-09-11 NOTE — Patient Instructions (Signed)
DUE TO COVID-19 ONLY ONE VISITOR IS ALLOWED TO COME WITH YOU AND STAY IN THE WAITING ROOM ONLY DURING PRE OP AND PROCEDURE DAY OF SURGERY. THE 1 VISITOR  MAY VISIT WITH YOU AFTER SURGERY IN YOUR PRIVATE ROOM DURING VISITING HOURS ONLY!               Daniel Walter   Your procedure is scheduled on: 09/22/20   Report to [facility] Main  Entrance   Report to admitting at : 10:15 AM     Call this number if you have problems the morning of surgery [phone]    Remember: NO SOLID FOOD AFTER MIDNIGHT THE NIGHT PRIOR TO SURGERY. NOTHING BY MOUTH EXCEPT CLEAR LIQUIDS UNTIL: 9:15 AM . PLEASE FINISH GATORADE DRINK PER SURGEON ORDER  WHICH NEEDS TO BE COMPLETED AT : 9:15 AM.   CLEAR LIQUID DIET  Foods Allowed                                                                     Foods Excluded  Coffee and tea, regular and decaf                             liquids that you cannot  Plain Jell-O any favor except red or purple                                           see through such as: Fruit ices (not with fruit pulp)                                     milk, soups, orange juice  Iced Popsicles                                    All solid food Carbonated beverages, regular and diet                                    Cranberry, grape and apple juices Sports drinks like Gatorade Lightly seasoned clear broth or consume(fat free) Sugar, honey syrup  Sample Menu Breakfast                                Lunch                                     Supper Cranberry juice                    Beef broth                            Chicken broth Jell-O  Grape juice                           Apple juice Coffee or tea                        Jell-O                                      Popsicle                                                Coffee or tea                        Coffee or tea  _____________________________________________________________________    BRUSH YOUR TEETH MORNING OF SURGERY AND RINSE YOUR MOUTH OUT, NO CHEWING GUM CANDY OR MINTS.   Take these medicines the morning of surgery with A SIP OF WATER: telmisartan,carvedilol,thyroid.                               You may not have any metal on your body including hair pins and              piercings  Do not wear jewelry, lotions, powders or perfumes, deodorant             Men may shave face and neck.   Do not bring valuables to the hospital. [facility] IS NOT             RESPONSIBLE   FOR VALUABLES.  Contacts, dentures or bridgework may not be worn into surgery.  Leave suitcase in the car. After surgery it may be brought to your room.     Patients discharged the day of surgery will not be allowed to drive home. IF YOU ARE HAVING SURGERY AND GOING HOME THE SAME DAY, YOU MUST HAVE AN ADULT TO DRIVE YOU HOME AND BE WITH YOU FOR 24 HOURS. YOU MAY GO HOME BY TAXI OR UBER OR ORTHERWISE, BUT AN ADULT MUST ACCOMPANY YOU HOME AND STAY WITH YOU FOR 24 HOURS.  Name and phone number of your driver:  Special Instructions: N/A              Please read over the following fact sheets you were given: ____________________________________________________________________           [facility] - Preparing for Surgery Before surgery, you can play an important role.  Because skin is not sterile, your skin needs to be as free of germs as possible.  You can reduce the number of germs on your skin by washing with CHG (chlorahexidine gluconate) soap before surgery.  CHG is an antiseptic cleaner which kills germs and bonds with the skin to continue killing germs even after washing. Please DO NOT use if you have an allergy to CHG or antibacterial soaps.  If your skin becomes reddened/irritated stop using the CHG and inform your nurse when you arrive at Short Stay. Do not shave (including legs and underarms) for at least 48 hours prior to the first CHG shower.  You may shave your face/neck. Please follow these  instructions carefully:  1.  Shower with CHG Soap the night before surgery and the  morning of Surgery.  2.  If you choose to wash your hair, wash your hair first as usual with your  normal  shampoo.  3.  After you shampoo, rinse your hair and body thoroughly to remove the  shampoo.                           4.  Use CHG as you would any other liquid soap.  You can apply chg directly  to the skin and wash                       Gently with a scrungie or clean washcloth.  5.  Apply the CHG Soap to your body ONLY FROM THE NECK DOWN.   Do not use on face/ open                           Wound or open sores. Avoid contact with eyes, ears mouth and genitals (private parts).                       Wash face,  Genitals (private parts) with your normal soap.             6.  Wash thoroughly, paying special attention to the area where your surgery  will be performed.  7.  Thoroughly rinse your body with warm water from the neck down.  8.  DO NOT shower/wash with your normal soap after using and rinsing off  the CHG Soap.                9.  Pat yourself dry with a clean towel.            10.  Wear clean pajamas.            11.  Place clean sheets on your bed the night of your first shower and do not  sleep with pets. Day of Surgery : Do not apply any lotions/deodorants the morning of surgery.  Please wear clean clothes to the hospital/surgery center.  FAILURE TO FOLLOW THESE INSTRUCTIONS MAY RESULT IN THE CANCELLATION OF YOUR SURGERY PATIENT SIGNATURE_________________________________  NURSE SIGNATURE__________________________________  ________________________________________________________________________   Daniel Walter  An incentive spirometer is a tool that can help keep your lungs clear and active. This tool measures how well you are filling your lungs with each breath. Taking long deep breaths may help reverse or decrease the chance of developing breathing (pulmonary) problems (especially  infection) following: A long period of time when you are unable to move or be active. BEFORE THE PROCEDURE  If the spirometer includes an indicator to show your best effort, your nurse or respiratory therapist will set it to a desired goal. If possible, sit up straight or lean slightly forward. Try not to slouch. Hold the incentive spirometer in an upright position. INSTRUCTIONS FOR USE  Sit on the edge of your bed if possible, or sit up as far as you can in bed or on a chair. Hold the incentive spirometer in an upright position. Breathe out normally. Place the mouthpiece in your mouth and seal your lips tightly around it. Breathe in slowly and as deeply as possible, raising the piston or the ball toward the top of the column. Hold your breath for 3-5 seconds or for as  long as possible. Allow the piston or ball to fall to the bottom of the column. Remove the mouthpiece from your mouth and breathe out normally. Rest for a few seconds and repeat Steps 1 through 7 at least 10 times every 1-2 hours when you are awake. Take your time and take a few normal breaths between deep breaths. The spirometer may include an indicator to show your best effort. Use the indicator as a goal to work toward during each repetition. After each set of 10 deep breaths, practice coughing to be sure your lungs are clear. If you have an incision (the cut made at the time of surgery), support your incision when coughing by placing a pillow or rolled up towels firmly against it. Once you are able to get out of bed, walk around indoors and cough well. You may stop using the incentive spirometer when instructed by your caregiver.  RISKS AND COMPLICATIONS Take your time so you do not get dizzy or light-headed. If you are in pain, you may need to take or ask for pain medication before doing incentive spirometry. It is harder to take a deep breath if you are having pain. AFTER USE Rest and breathe slowly and easily. It can be  helpful to keep track of a log of your progress. Your caregiver can provide you with a simple table to help with this. If you are using the spirometer at home, follow these instructions: SEEK MEDICAL CARE IF:  You are having difficultly using the spirometer. You have trouble using the spirometer as often as instructed. Your pain medication is not giving enough relief while using the spirometer. You develop fever of 100.5 F (38.1 C) or higher. SEEK IMMEDIATE MEDICAL CARE IF:  You cough up bloody sputum that had not been present before. You develop fever of 102 F (38.9 C) or greater. You develop worsening pain at or near the incision site. MAKE SURE YOU:  Understand these instructions. Will watch your condition. Will get help right away if you are not doing well or get worse. Document Released: 07/01/2006 Document Revised: 05/13/2011 Document Reviewed: 09/01/2006 [facility] Patient Information 2014 [place], [place].   ________________________________________________________________________

## 2020-09-13 ENCOUNTER — Other Ambulatory Visit: Payer: Self-pay

## 2020-09-13 ENCOUNTER — Encounter (HOSPITAL_COMMUNITY): Payer: Self-pay

## 2020-09-13 ENCOUNTER — Encounter (HOSPITAL_COMMUNITY)
Admission: RE | Admit: 2020-09-13 | Discharge: 2020-09-13 | Disposition: A | Discharge status: Home / Self Care | Payer: 59 | Source: Ambulatory Visit | Attending: Orthopedic Surgery | Admitting: Orthopedic Surgery

## 2020-09-13 DIAGNOSIS — Z01812 Encounter for preprocedural laboratory examination: Secondary | ICD-10-CM | POA: Insufficient documentation

## 2020-09-13 HISTORY — DX: Anxiety disorder, unspecified: F41.9

## 2020-09-13 HISTORY — DX: Hypothyroidism, unspecified: E03.9

## 2020-09-13 HISTORY — DX: Prediabetes: R73.03

## 2020-09-13 LAB — BASIC METABOLIC PANEL
Anion gap: 6 (ref 5–15)
BUN: 14 mg/dL (ref 8–23)
CO2: 28 mmol/L (ref 22–32)
Calcium: 9.2 mg/dL (ref 8.9–10.3)
Chloride: 103 mmol/L (ref 98–111)
Creatinine, Ser: 1.01 mg/dL (ref 0.61–1.24)
GFR, Estimated: >60 mL/min (ref >60)
Glucose, Bld: 167 mg/dL — ABNORMAL HIGH (ref 70–99)
Potassium: 4.5 mmol/L (ref 3.5–5.1)
Sodium: 137 mmol/L (ref 135–145)

## 2020-09-13 LAB — CBC
HCT: 42.9 % (ref 39.0–52.0)
Hemoglobin: 14 g/dL (ref 13.0–17.0)
MCH: 31.3 pg (ref 26.0–34.0)
MCHC: 32.6 g/dL (ref 30.0–36.0)
MCV: 95.8 fL (ref 80.0–100.0)
Platelets: 344 10*3/uL (ref 150–400)
RBC: 4.48 MIL/uL (ref 4.22–5.81)
RDW: 12.8 % (ref 11.5–15.5)
WBC: 8.3 10*3/uL (ref 4.0–10.5)
nRBC: 0 % (ref 0.0–0.2)

## 2020-09-13 LAB — HEMOGLOBIN A1C
Hgb A1c MFr Bld: 5.8 % — ABNORMAL HIGH (ref 4.8–5.6)
Mean Plasma Glucose: 119.76 mg/dL

## 2020-09-13 NOTE — Progress Notes (Signed)
COVID Vaccine Completed:NO Date COVID Vaccine completed: COVID vaccine manufacturer: Pfizer    [facility] [employer] [employer]   PCP - Jarrett Soho: PA-C Cardiologist - Dr. Yates Decamp. LOV: 12/16/19. Clearance: Celeste Cantwell: PA-C: 09/07/20 : EPIC  Chest x-ray -  EKG - 09/07/20 Stress Test -  ECHO - 01/29/20 EPIC Cardiac Cath - 10/02/15 Pacemaker/ICD device last checked:  Sleep Study -  CPAP -   Fasting Blood Sugar -  Checks Blood Sugar _____ times a day  Blood Thinner Instructions: Aspirin Instructions: Last Dose:  Anesthesia review: Hx: CAD,MI,pre-DIA,HTN  Patient denies shortness of breath, fever, cough and chest pain at PAT appointment   Patient verbalized understanding of instructions that were given to them at the PAT appointment. Patient was also instructed that they will need to review over the PAT instructions again at home before surgery.

## 2020-09-14 NOTE — Progress Notes (Signed)
Anesthesia Chart Review   Case: [id number] Date/Time: 09/22/20 1200   Procedure: Right Shoulder scope rotator cuff repair ,subacromial decompression and distal clavicle resection (Right) -   Anesthesia type: Choice   Pre-op diagnosis: Right Shoulder rotator cuff impingment ,acromiclavicular  osteoarthritis   Location: [facility] ROOM 05 / [facility]   Surgeons: Yolonda Kida, MD       DISCUSSION:65 y.o. never smoker with h/o GERD, HTN, CAD (DES), right shoulder OA scheduled for above procedure 09/22/20 with Dr. Yolonda Kida.   Pt last seen by cardiology 09/07/2020. Per OV note, "Patient presents today for preoperative surgical risk stratification for right shoulder arthroscopy with Dr. Duwayne Heck.  From a cardiovascular standpoint patient is stable.  Discussed with patient regarding perioperative cardiovascular risks.  Patient understands secondary to patient of his same age and sex without his cardiovascular history he is at a relatively elevated risk.  However do not feel this risk is prohibitive of proceeding with right shoulder arthroscopy.  We will send surgical restratification form to Dr. Duwayne Heck."  Anticipate pt can proceed with planned procedure barring acute status change.   VS: BP 132/82   Pulse 80   Temp 37 C (Oral)   Ht 5\' 10"  (1.778 m)   Wt 88.9 kg   SpO2 98%   BMI 28.12 kg/m   PROVIDERS: , PA-C is PCP   Jarrett Soho, MD is Cardiologist  LABS: Labs reviewed: Acceptable for surgery. (all labs ordered are listed, but only abnormal results are displayed)  Labs Reviewed  BASIC METABOLIC PANEL - Abnormal; Notable for the following components:      Result Value   Glucose, Bld 167 (*)    All other components within normal limits  HEMOGLOBIN A1C - Abnormal; Notable for the following components:   Hgb A1c MFr Bld 5.8 (*)    All other components within normal limits  CBC     IMAGES:   EKG: 09/07/2020: Sinus rhythm at a rate of 90 bpm.   Left atrial enlargement.  Normal axis.  Nonspecific T wave abnormality.  No significant change compared to EKG 02/23/2020.   CV: Echo 01/29/2020  1. Left ventricular ejection fraction, by estimation, is 65 to 70%. The  left ventricle has normal function. The left ventricle has no regional  wall motion abnormalities. There is mild left ventricular hypertrophy.  Left ventricular diastolic parameters  are consistent with Grade I diastolic dysfunction (impaired relaxation).   2. Right ventricular systolic function is normal. The right ventricular  size is normal. Tricuspid regurgitation signal is inadequate for assessing  PA pressure.   3. The mitral valve is grossly normal. Trivial mitral valve  regurgitation.   4. The aortic valve is tricuspid. Aortic valve regurgitation is not  visualized.   5. The inferior vena cava is normal in size with greater than 50%  respiratory variability, suggesting right atrial pressure of 3 mmHg.   6. Cannot exclude PFO.   Cardiac Cath 10/02/2015 Ost LAD to Prox LAD lesion, 99 %stenosed. XIENCE ALPINE RX 10/04/2015 drug eluting stent. Post intervention, there is a 0% residual stenosis. TIMI flow improved from TIMI 1 to TIMI 3 at end of the procedure. Otherwise normal coronary arteries. There is severe left ventricular systolic dysfunction. The left ventricular ejection fraction is less than 25% by visual estimate.  Past Medical History:  Diagnosis Date   Anxiety    CAD (coronary artery disease)    Coronary artery disease    ED (erectile  dysfunction)    GERD (gastroesophageal reflux disease)    Hypertension    Hypothyroidism    MI (myocardial infarction) (HCC)    Myalgia due to statin    Pre-diabetes     Past Surgical History:  Procedure Laterality Date   APPENDECTOMY     CARDIAC CATHETERIZATION N/A 10/02/2015   Procedure: Left Heart Cath and Coronary Angiography;  Surgeon: Yates Decamp, MD;  Location: [facility] INVASIVE CV LAB;  Service: Cardiovascular;   Laterality: N/A;   CARDIAC CATHETERIZATION N/A 10/02/2015   Procedure: Coronary Stent Intervention;  Surgeon: Yates Decamp, MD;  Location: [facility] INVASIVE CV LAB;  Service: Cardiovascular;  Laterality: N/A;  Prox LAD   CHOLECYSTECTOMY     CORONARY ANGIOPLASTY     KNEE ARTHROSCOPY      MEDICATIONS:  Ascorbic Acid (VITAMIN C) 250 MG CHEW   aspirin 81 MG chewable tablet   Barberry-Oreg Grape-Goldenseal (BERBERINE COMPLEX PO)   carvedilol (COREG) 6.25 MG tablet   Cholecalciferol (VITAMIN D3) 5000 units CAPS   DHEA 25 MG CAPS   Digestive Enzymes (DIGESTIVE ENZYME PO)   ibuprofen (ADVIL) 200 MG tablet   OVER THE COUNTER MEDICATION   OVER THE COUNTER MEDICATION   Probiotic Product (PROBIOTIC ADVANCED PO)   sildenafil (REVATIO) 20 MG tablet   telmisartan (MICARDIS) 80 MG tablet   testosterone cypionate (DEPOTESTOSTERONE CYPIONATE) 200 MG/ML injection   thyroid (ARMOUR) 60 MG tablet   Zinc 50 MG CAPS   No current facility-administered medications for this encounter.    Jodell Cipro, PA-C [facility] [phone]

## 2020-09-14 NOTE — Anesthesia Preprocedure Evaluation (Addendum)
Anesthesia Evaluation  Patient identified by MRN, date of birth, ID band Patient awake    Reviewed: Allergy & Precautions, NPO status , Patient's Chart, lab work & pertinent test results, reviewed documented beta blocker date and time   History of Anesthesia Complications Negative for: history of anesthetic complications  Airway Mallampati: II  TM Distance: >3 FB Neck ROM: Full    Dental  (+) Dental Advisory Given, Missing, Chipped   Pulmonary neg pulmonary ROS,    breath sounds clear to auscultation       Cardiovascular hypertension, Pt. on medications and Pt. on home beta blockers (-) angina+ CAD (LAD stent for single vessel ASCAD), + Past MI and + Cardiac Stents   Rhythm:Regular Rate:Normal  2021 ECHO: EF 65-70%, normal LVF, Grade 1 DD, normal RVF, no significant valvular abnormalities   Neuro/Psych Anxiety negative neurological ROS     GI/Hepatic Neg liver ROS, GERD  Medicated and Controlled,  Endo/Other  Hypothyroidism   Renal/GU negative Renal ROS     Musculoskeletal   Abdominal   Peds  Hematology negative hematology ROS (+)   Anesthesia Other Findings   Reproductive/Obstetrics                           Anesthesia Physical Anesthesia Plan  ASA: 3  Anesthesia Plan: General   Post-op Pain Management: GA combined w/ Regional for post-op pain   Induction: Intravenous  PONV Risk Score and Plan: 2 and Ondansetron and Dexamethasone  Airway Management Planned: Oral ETT  Additional Equipment: None  Intra-op Plan:   Post-operative Plan: Extubation in OR  Informed Consent: I have reviewed the patients History and Physical, chart, labs and discussed the procedure including the risks, benefits and alternatives for the proposed anesthesia with the patient or authorized representative who has indicated his/her understanding and acceptance.     Dental advisory given  Plan Discussed  with: CRNA and Surgeon  Anesthesia Plan Comments: (See PAT note 09/13/2020, Jodell Cipro, PA-C Plan routine monitors, GETA with interscalene block for post op analgesia)      Anesthesia Quick Evaluation

## 2020-09-22 ENCOUNTER — Encounter (HOSPITAL_COMMUNITY): Payer: Self-pay | Admitting: Orthopedic Surgery

## 2020-09-22 ENCOUNTER — Ambulatory Visit (HOSPITAL_COMMUNITY): Payer: 59 | Admitting: Certified Registered Nurse Anesthetist

## 2020-09-22 ENCOUNTER — Ambulatory Visit (HOSPITAL_COMMUNITY)
Admission: RE | Admit: 2020-09-22 | Discharge: 2020-09-22 | Disposition: A | Discharge status: Home / Self Care | Payer: 59 | Attending: Orthopedic Surgery | Admitting: Orthopedic Surgery

## 2020-09-22 ENCOUNTER — Ambulatory Visit (HOSPITAL_COMMUNITY): Payer: 59 | Admitting: Physician Assistant

## 2020-09-22 ENCOUNTER — Other Ambulatory Visit: Payer: Self-pay

## 2020-09-22 ENCOUNTER — Encounter (HOSPITAL_COMMUNITY)
Admission: RE | Disposition: A | Discharge status: Home / Self Care | Payer: Self-pay | Source: Home / Self Care | Attending: Orthopedic Surgery

## 2020-09-22 DIAGNOSIS — M7521 Bicipital tendinitis, right shoulder: Secondary | ICD-10-CM | POA: Insufficient documentation

## 2020-09-22 DIAGNOSIS — M25811 Other specified joint disorders, right shoulder: Secondary | ICD-10-CM | POA: Diagnosis not present

## 2020-09-22 DIAGNOSIS — I2510 Atherosclerotic heart disease of native coronary artery without angina pectoris: Secondary | ICD-10-CM | POA: Diagnosis not present

## 2020-09-22 DIAGNOSIS — I10 Essential (primary) hypertension: Secondary | ICD-10-CM | POA: Insufficient documentation

## 2020-09-22 DIAGNOSIS — M75121 Complete rotator cuff tear or rupture of right shoulder, not specified as traumatic: Secondary | ICD-10-CM | POA: Insufficient documentation

## 2020-09-22 DIAGNOSIS — I252 Old myocardial infarction: Secondary | ICD-10-CM | POA: Insufficient documentation

## 2020-09-22 DIAGNOSIS — K219 Gastro-esophageal reflux disease without esophagitis: Secondary | ICD-10-CM | POA: Diagnosis not present

## 2020-09-22 DIAGNOSIS — Z955 Presence of coronary angioplasty implant and graft: Secondary | ICD-10-CM | POA: Diagnosis not present

## 2020-09-22 DIAGNOSIS — M19011 Primary osteoarthritis, right shoulder: Secondary | ICD-10-CM | POA: Insufficient documentation

## 2020-09-22 HISTORY — PX: SHOULDER ARTHROSCOPY WITH ROTATOR CUFF REPAIR: SHX5685

## 2020-09-22 SURGERY — ARTHROSCOPY, SHOULDER, WITH ROTATOR CUFF REPAIR
Anesthesia: General | Laterality: Right

## 2020-09-22 MED ORDER — ONDANSETRON 4 MG PO TBDP: 4.0000 mg | ORAL_TABLET | Freq: Three times a day (TID) | ORAL | 0 refills | Status: DC | PRN

## 2020-09-22 MED ORDER — SODIUM CHLORIDE (PF) 0.9 % IJ SOLN
INTRAMUSCULAR | Status: AC
  Filled 2020-09-22: qty 10

## 2020-09-22 MED ORDER — OXYCODONE HCL 5 MG/5ML PO SOLN: 5.0000 mg | Freq: Once | ORAL | Status: DC | PRN

## 2020-09-22 MED ORDER — LIDOCAINE 2% (20 MG/ML) 5 ML SYRINGE
INTRAMUSCULAR | Status: AC
  Filled 2020-09-22: qty 5

## 2020-09-22 MED ORDER — ORAL CARE MOUTH RINSE: 15.0000 mL | Freq: Once | OROMUCOSAL | Status: AC

## 2020-09-22 MED ORDER — SODIUM CHLORIDE 0.9 % IV SOLN
2.0000 g | INTRAVENOUS | Status: AC
  Administered 2020-09-22: 2 g via INTRAVENOUS
  Filled 2020-09-22: qty 2

## 2020-09-22 MED ORDER — DEXAMETHASONE SODIUM PHOSPHATE 10 MG/ML IJ SOLN
INTRAMUSCULAR | Status: DC | PRN
  Administered 2020-09-22: 10 mg via INTRAVENOUS

## 2020-09-22 MED ORDER — HYDROMORPHONE HCL 1 MG/ML IJ SOLN: 0.2500 mg | INTRAMUSCULAR | Status: DC | PRN

## 2020-09-22 MED ORDER — SUGAMMADEX SODIUM 200 MG/2ML IV SOLN
INTRAVENOUS | Status: DC | PRN
  Administered 2020-09-22: 200 mg via INTRAVENOUS

## 2020-09-22 MED ORDER — CHLORHEXIDINE GLUCONATE 0.12 % MT SOLN
15.0000 mL | Freq: Once | OROMUCOSAL | Status: AC
  Administered 2020-09-22: 15 mL via OROMUCOSAL

## 2020-09-22 MED ORDER — BUPIVACAINE-EPINEPHRINE (PF) 0.5% -1:200000 IJ SOLN
INTRAMUSCULAR | Status: DC | PRN
  Administered 2020-09-22: 10 mL via PERINEURAL

## 2020-09-22 MED ORDER — DEXAMETHASONE SODIUM PHOSPHATE 10 MG/ML IJ SOLN
INTRAMUSCULAR | Status: AC
  Filled 2020-09-22: qty 1

## 2020-09-22 MED ORDER — SODIUM CHLORIDE 0.9 % IR SOLN
Status: DC | PRN
  Administered 2020-09-22: 12000 mL

## 2020-09-22 MED ORDER — OXYCODONE HCL 5 MG PO TABS: 5.0000 mg | ORAL_TABLET | ORAL | 0 refills | Status: DC | PRN

## 2020-09-22 MED ORDER — ONDANSETRON HCL 4 MG/2ML IJ SOLN
INTRAMUSCULAR | Status: AC
  Filled 2020-09-22: qty 2

## 2020-09-22 MED ORDER — PHENYLEPHRINE 40 MCG/ML (10ML) SYRINGE FOR IV PUSH (FOR BLOOD PRESSURE SUPPORT)
PREFILLED_SYRINGE | INTRAVENOUS | Status: AC
  Filled 2020-09-22: qty 10

## 2020-09-22 MED ORDER — ONDANSETRON HCL 4 MG/2ML IJ SOLN
INTRAMUSCULAR | Status: DC | PRN
  Administered 2020-09-22: 4 mg via INTRAVENOUS

## 2020-09-22 MED ORDER — BUPIVACAINE-EPINEPHRINE (PF) 0.25% -1:200000 IJ SOLN
INTRAMUSCULAR | Status: AC
  Filled 2020-09-22: qty 30

## 2020-09-22 MED ORDER — FENTANYL CITRATE (PF) 100 MCG/2ML IJ SOLN
50.0000 ug | INTRAMUSCULAR | Status: DC
  Administered 2020-09-22: 100 ug via INTRAVENOUS
  Filled 2020-09-22: qty 2

## 2020-09-22 MED ORDER — FENTANYL CITRATE (PF) 100 MCG/2ML IJ SOLN
INTRAMUSCULAR | Status: DC | PRN
  Administered 2020-09-22 (×2): 25 ug via INTRAVENOUS

## 2020-09-22 MED ORDER — 0.9 % SODIUM CHLORIDE (POUR BTL) OPTIME
TOPICAL | Status: DC | PRN
  Administered 2020-09-22: 1000 mL

## 2020-09-22 MED ORDER — OXYCODONE HCL 5 MG PO TABS: 5.0000 mg | ORAL_TABLET | Freq: Once | ORAL | Status: DC | PRN

## 2020-09-22 MED ORDER — LACTATED RINGERS IV SOLN: INTRAVENOUS | Status: DC

## 2020-09-22 MED ORDER — ROCURONIUM BROMIDE 10 MG/ML (PF) SYRINGE
PREFILLED_SYRINGE | INTRAVENOUS | Status: DC | PRN
  Administered 2020-09-22: 60 mg via INTRAVENOUS

## 2020-09-22 MED ORDER — PROMETHAZINE HCL 25 MG/ML IJ SOLN: 6.2500 mg | INTRAMUSCULAR | Status: DC | PRN

## 2020-09-22 MED ORDER — ROCURONIUM BROMIDE 10 MG/ML (PF) SYRINGE
PREFILLED_SYRINGE | INTRAVENOUS | Status: AC
  Filled 2020-09-22: qty 10

## 2020-09-22 MED ORDER — FENTANYL CITRATE (PF) 100 MCG/2ML IJ SOLN
INTRAMUSCULAR | Status: AC
  Filled 2020-09-22: qty 2

## 2020-09-22 MED ORDER — ACETAMINOPHEN 500 MG PO TABS: 1000.0000 mg | ORAL_TABLET | Freq: Once | ORAL | Status: DC

## 2020-09-22 MED ORDER — MIDAZOLAM HCL 2 MG/2ML IJ SOLN
1.0000 mg | INTRAMUSCULAR | Status: DC
  Administered 2020-09-22: 2 mg via INTRAVENOUS
  Filled 2020-09-22: qty 2

## 2020-09-22 MED ORDER — PROPOFOL 10 MG/ML IV BOLUS
INTRAVENOUS | Status: AC
  Filled 2020-09-22: qty 20

## 2020-09-22 MED ORDER — PHENYLEPHRINE HCL-NACL 10-0.9 MG/250ML-% IV SOLN
INTRAVENOUS | Status: DC | PRN
  Administered 2020-09-22: 35 ug/min via INTRAVENOUS

## 2020-09-22 MED ORDER — MEPERIDINE HCL 50 MG/ML IJ SOLN: 6.2500 mg | INTRAMUSCULAR | Status: DC | PRN

## 2020-09-22 MED ORDER — PROPOFOL 10 MG/ML IV BOLUS
INTRAVENOUS | Status: DC | PRN
  Administered 2020-09-22: 150 mg via INTRAVENOUS

## 2020-09-22 MED ORDER — EPINEPHRINE PF 1 MG/ML IJ SOLN
INTRAMUSCULAR | Status: AC
  Filled 2020-09-22: qty 1

## 2020-09-22 MED ORDER — EPINEPHRINE 1 MG/ML IJ SOLN
INTRAMUSCULAR | Status: DC | PRN
  Administered 2020-09-22: 1 mg

## 2020-09-22 MED ORDER — MIDAZOLAM HCL 2 MG/2ML IJ SOLN: 0.5000 mg | Freq: Once | INTRAMUSCULAR | Status: DC | PRN

## 2020-09-22 MED ORDER — BUPIVACAINE LIPOSOME 1.3 % IJ SUSP
INTRAMUSCULAR | Status: DC | PRN
  Administered 2020-09-22: 10 mL via PERINEURAL

## 2020-09-22 MED FILL — Sodium Chloride Irrigation Soln 0.9%: Qty: 500 | Status: AC

## 2020-09-22 MED FILL — Epinephrine Inj 1 MG/ML (1:1000): INTRAMUSCULAR | Qty: 1 | Status: AC

## 2020-09-22 SURGICAL SUPPLY — 81 items
ANCHOR SL BIO 4.75 W/TIGERTAPE (Anchor) ×2 IMPLANT
BAG COUNTER SPONGE SURGICOUNT (BAG) ×2 IMPLANT
BLADE SURG 15 STRL LF DISP TIS (BLADE) IMPLANT
BLADE SURG 15 STRL SS (BLADE)
BLADE SURG SZ11 CARB STEEL (BLADE) ×2 IMPLANT
BURR OVAL 8 FLU 4.0X13 (MISCELLANEOUS) ×2 IMPLANT
CANNULA 5.75X7 CRYSTAL CLEAR (CANNULA) ×2 IMPLANT
CANNULA 5.75X71 LONG (CANNULA) ×2 IMPLANT
CANNULA TWIST IN 8.25X7CM (CANNULA) ×2 IMPLANT
COVER MAYO STAND STRL (DRAPES) ×2 IMPLANT
CUTTER BONE 4.0MM X 13CM (MISCELLANEOUS) ×2 IMPLANT
DISSECTOR  3.8MM X 13CM (MISCELLANEOUS)
DISSECTOR 3.8MM X 13CM (MISCELLANEOUS) IMPLANT
DRAPE ORTHO SPLIT 77X108 STRL (DRAPES) ×4
DRAPE POUCH INSTRU U-SHP 10X18 (DRAPES) ×2 IMPLANT
DRAPE SHEET LG 3/4 BI-LAMINATE (DRAPES) ×2 IMPLANT
DRAPE STERI 35X30 U-POUCH (DRAPES) ×2 IMPLANT
DRAPE SURG 17X23 STRL (DRAPES) ×2 IMPLANT
DRAPE SURG ORHT 6 SPLT 77X108 (DRAPES) ×2 IMPLANT
DRAPE U-SHAPE 47X51 STRL (DRAPES) ×2 IMPLANT
DRSG PAD ABDOMINAL 8X10 ST (GAUZE/BANDAGES/DRESSINGS) ×6 IMPLANT
DURAPREP 26ML APPLICATOR (WOUND CARE) ×2 IMPLANT
ELECT MENISCUS 165MM 90D (ELECTRODE) IMPLANT
ELECT REM PT RETURN 15FT ADLT (MISCELLANEOUS) IMPLANT
FIBERSTICK 2 (SUTURE) IMPLANT
GAUZE SPONGE 4X4 12PLY STRL (GAUZE/BANDAGES/DRESSINGS) ×2 IMPLANT
GAUZE XEROFORM 1X8 LF (GAUZE/BANDAGES/DRESSINGS) IMPLANT
GLOVE SRG 8 PF TXTR STRL LF DI (GLOVE) ×1 IMPLANT
GLOVE SURG ENC MOIS LTX SZ7.5 (GLOVE) ×2 IMPLANT
GLOVE SURG UNDER POLY LF SZ8 (GLOVE) ×2
GOWN STRL REUS W/TWL LRG LVL3 (GOWN DISPOSABLE) ×2 IMPLANT
IV NS IRRIG 3000ML ARTHROMATIC (IV SOLUTION) ×6 IMPLANT
KIT BASIN OR (CUSTOM PROCEDURE TRAY) ×2 IMPLANT
KIT TURNOVER KIT A (KITS) ×2 IMPLANT
LASSO SUT 90 DEGREE (SUTURE) IMPLANT
LOOP 2 FIBERLINK CLOSED (SUTURE) IMPLANT
MANIFOLD NEPTUNE II (INSTRUMENTS) ×2 IMPLANT
NDL SAFETY ECLIPSE 18X1.5 (NEEDLE) IMPLANT
NEEDLE 1/2 CIR CATGUT .05X1.09 (NEEDLE) IMPLANT
NEEDLE FILTER BLUNT 18X 1/2SAF (NEEDLE) ×1
NEEDLE FILTER BLUNT 18X1 1/2 (NEEDLE) ×1 IMPLANT
NEEDLE HYPO 18GX1.5 SHARP (NEEDLE)
NEEDLE SCORPION MULTI FIRE (NEEDLE) ×2 IMPLANT
PACK ARTHROSCOPY WL (CUSTOM PROCEDURE TRAY) ×2 IMPLANT
PAD ARMBOARD 7.5X6 YLW CONV (MISCELLANEOUS) ×4 IMPLANT
PENCIL SMOKE EVACUATOR (MISCELLANEOUS) IMPLANT
PORT APPOLLO RF 90DEGREE MULTI (SURGICAL WAND) ×2 IMPLANT
PROBE APOLLO 90XL (SURGICAL WAND) IMPLANT
PROBE BIPOLAR ATHRO 135MM 90D (MISCELLANEOUS) ×2 IMPLANT
SLEEVE ARM SUSPENSION SYSTEM (MISCELLANEOUS) ×2 IMPLANT
SLING S3 LATERAL DISP (MISCELLANEOUS) IMPLANT
SLING ULTRA II AB L (ORTHOPEDIC SUPPLIES) IMPLANT
SLING ULTRA II S (ORTHOPEDIC SUPPLIES) ×2 IMPLANT
SPONGE T-LAP 4X18 ~~LOC~~+RFID (SPONGE) IMPLANT
STRIP CLOSURE SKIN 1/2X4 (GAUZE/BANDAGES/DRESSINGS) ×2 IMPLANT
SUCTION FRAZIER HANDLE 10FR (MISCELLANEOUS)
SUCTION TUBE FRAZIER 10FR DISP (MISCELLANEOUS) IMPLANT
SUT 2 FIBERLOOP 20 STRT BLUE (SUTURE)
SUT ETHILON 3 0 PS 1 (SUTURE) IMPLANT
SUT FIBERWIRE #2 38 T-5 BLUE (SUTURE)
SUT LASSO 45 DEGREE LEFT (SUTURE) IMPLANT
SUT LASSO 45D RIGHT (SUTURE) IMPLANT
SUT MNCRL AB 3-0 PS2 18 (SUTURE) ×2 IMPLANT
SUT MNCRL AB 3-0 PS2 27 (SUTURE) IMPLANT
SUT PDS AB 0 CT1 36 (SUTURE) IMPLANT
SUT TIGER TAPE 7 IN WHITE (SUTURE) IMPLANT
SUT VIC AB 0 CT1 36 (SUTURE) IMPLANT
SUT VIC AB 2-0 CT1 27 (SUTURE)
SUT VIC AB 2-0 CT1 TAPERPNT 27 (SUTURE) IMPLANT
SUTURE 2 FIBERLOOP 20 STRT BLU (SUTURE) IMPLANT
SUTURE FIBERWR #2 38 T-5 BLUE (SUTURE) IMPLANT
SUTURE TAPE 1.3 40 TPR END (SUTURE) ×2 IMPLANT
SUTURETAPE 1.3 40 TPR END (SUTURE) ×4
SYR 27GX1/2 1ML LL SAFETY (SYRINGE) IMPLANT
SYR CONTROL 10ML LL (SYRINGE) IMPLANT
TAPE CLOTH SURG 4X10 WHT LF (GAUZE/BANDAGES/DRESSINGS) ×2 IMPLANT
TAPE CLOTH SURG 6X10 WHT LF (GAUZE/BANDAGES/DRESSINGS) ×2 IMPLANT
TOWEL OR 17X26 10 PK STRL BLUE (TOWEL DISPOSABLE) ×4 IMPLANT
TUBING ARTHROSCOPY IRRIG 16FT (MISCELLANEOUS) ×4 IMPLANT
TUBING CONNECTING 10 (TUBING) ×4 IMPLANT
YANKAUER SUCT BULB TIP NO VENT (SUCTIONS) IMPLANT

## 2020-09-22 NOTE — Anesthesia Postprocedure Evaluation (Signed)
Anesthesia Post Note  Patient: Daniel Walter  Procedure(s) Performed: Right Shoulder scope rotator cuff repair ,subacromial decompression and distal clavicle resection (Right)     Patient location during evaluation: PACU Anesthesia Type: General and Regional Level of consciousness: awake and alert, patient cooperative and oriented Pain management: pain level controlled Vital Signs Assessment: post-procedure vital signs reviewed and stable Respiratory status: spontaneous breathing, nonlabored ventilation and respiratory function stable Cardiovascular status: blood pressure returned to baseline and stable Postop Assessment: no apparent nausea or vomiting Anesthetic complications: no   No notable events documented.  Last Vitals:  Vitals:   09/22/20 1500 09/22/20 1515  BP: 129/86 130/90  Pulse: 86 86  Resp: (!) 23 20  Temp: (!) 36.1 C 36.4 C  SpO2: 98% 97%    Last Pain:  Vitals:   09/22/20 1515  TempSrc:   PainSc: 0-No pain                 Orie Baxendale,E. Arnaldo Heffron

## 2020-09-22 NOTE — Discharge Instructions (Signed)
Orthopedic discharge instructions:  -Maintain arm in sling at all times.  You can remove the arm for bathing and getting dressed but you should not actively move through the right shoulder.  You are okay to use the hand, wrist, and elbow as able in the sling for light lifting up to 2 pounds.  -You may remove your postoperative bandages in 3 days.  On the third day from surgery may also begin showering.  Do not submerge wounds underwater.  -Apply ice to the right shoulder liberally throughout the day.  She do this for 20 to 30 minutes out of each hour that you are awake and able.  For mild to moderate pain use Tylenol and Advil in alternating fashion.  For any breakthrough pain use oxycodone as necessary.  -You will return to see Dr. Aundria Rud in the office in 2 weeks.

## 2020-09-22 NOTE — Brief Op Note (Signed)
09/22/2020  2:10 PM  PATIENT:  Daniel Walter  65 y.o. male  PRE-OPERATIVE DIAGNOSIS:  Right Shoulder rotator cuff impingment ,acromiclavicular  osteoarthritis  POST-OPERATIVE DIAGNOSIS:  Right Shoulder rotator cuff tear, shoulder impingment ,acromiclavicular  osteoarthritis  PROCEDURE:  Procedure(s) with comments: Right Shoulder scope rotator cuff repair ,subacromial decompression and distal clavicle resection (Right) -  SURGEON:  Surgeon(s) and Role:    Yolonda Kida, MD - Primary  PHYSICIAN ASSISTANT: Dion Saucier, PA-C  ANESTHESIA:   regional and general  EBL:  5 cc  BLOOD ADMINISTERED:none  DRAINS: none   LOCAL MEDICATIONS USED:  NONE  SPECIMEN:  No Specimen  DISPOSITION OF SPECIMEN:  N/A  COUNTS:  YES  TOURNIQUET:  * No tourniquets in log *  DICTATION: .Note written in EPIC  PLAN OF CARE: Discharge to home after PACU  PATIENT DISPOSITION:  PACU - hemodynamically stable.   Delay start of Pharmacological VTE agent (>24hrs) due to surgical blood loss or risk of bleeding: not applicable

## 2020-09-22 NOTE — Op Note (Signed)
Date of Surgery: 09/22/2020  INDICATIONS: Daniel Walter is a 65 y.o.-year-old male with a right shoulder rotator cuff tear, biceps tendinitis, subacromial impingement, and distal clavicle osteoarthritis;  The patient did consent to the procedure after discussion of the risks and benefits.  PREOPERATIVE DIAGNOSIS:  1.  Right shoulder rotator cuff tear, supraspinatus, complete. 2.  Right shoulder proximal biceps tendinitis 3.  Right shoulder subacromial impingement 4.  Right shoulder acromioclavicular joint arthritis  POSTOPERATIVE DIAGNOSIS:  1.  Right shoulder rotator cuff tear, supraspinatus, complete. 2.  Right shoulder proximal biceps tendinitis 3.  Right shoulder subacromial impingement 4.  Right shoulder acromioclavicular joint arthritis 5.  Right shoulder degenerative labral tearing  PROCEDURE:  1.  Right shoulder arthroscopic rotator cuff tear repair. 2.  Right shoulder extensive debridement arthroscopic, including rotator interval, superior labrum, anterior labrum, biceps tenotomy 3.  Right shoulder arthroscopic subacromial decompression 4.  Right shoulder arthroscopic Mumford procedure (distal clavicle resection of approximately 1 cm)  SURGEON: Maryan Rued, M.D.  ASSIST: Daniel Saucier, PA-C.  Assistant attestation: PA Mcclung was present for the entire procedure.  Daniel Walter participated in all critical portions.  ANESTHESIA:  general, interscalene block  IV FLUIDS AND URINE: See anesthesia.  ESTIMATED BLOOD LOSS: 10  mL.  IMPLANTS:  Arthrex suture tape sutures x2 Arthrex 4.75 mm knotless swivel lock anchor  DRAINS: None  COMPLICATIONS: None.  DESCRIPTION OF PROCEDURE: The patient was brought to the operating room and placed supine on the operating table.  The patient had been signed prior to the procedure and this was documented. The patient had the anesthesia placed by the anesthesiologist.  A time-out was performed to confirm that this was the correct patient, site,  side and location. The patient did receive antibiotics prior to the incision and was re-dosed during the procedure as needed at indicated intervals.  A tourniquet was not placed.  The patient had the operative extremity prepped and draped in the standard surgical fashion.      After obtaining informed consent the patient was brought to the operating table and underwent satisfactory anesthesia. An exam under anesthesia revealed full range of motion. Daniel Walter was placed in the left lateral decubitus position with an axillary roll and all bony prominences properly padded. A standard surgical timeout was performed. Daniel Walter was placed in 10 pounds of gentle in-line suspension.  Standard posterior and anterior superior portals were established. A diagnostic evaluation of the glenohumeral joint was performed. The biceps tendon was markedly synovitic and partially torn. It was tenotomized.  Diagnostic arthroscopy also indicated full-thickness tearing of the supraspinatus with no involvement of the infraspinatus.  Subscapularis was intact, as well as teres minor.  No glenohumeral arthritis.  Degenerative tearing of the anterior, superior, and posterior labrum.  No loose bodies.    An extensive debridement was performed of the superior anterior and posterior labrum. The articular surfaces revealed no significant chondromalacia . The subscapularis was intact. The rotator cuff was torn and the greater tuberosity footprint was prepared for repair. Releases were performed on the capsular surface of the rotator cuff.  Biceps tendon was tenotomized.  The arthroscope was inserted in the subacromial space and an additional lateral portal was established. An acromioplasty performed nicely decompressing the subacromial space with a motorized burr.  This was accomplished with while viewing from the lateral portal and working through the posterior portal and a cutting block technique.  Bursitis in the subacromial space was removed as well  as releases were performed on the bursal surface  of the rotator cuff. Next cannulas were placed appropriately.   Next we performed the arthroscopic rotator cuff repair.  We identified a crescent-shaped tear of the supraspinatus.  This measured 1.5 cm from anterior to posterior and 1.0 cm from medial to lateral.  Tissue quality was fair but somewhat thinned.  We then passed an independent fashion 2 separate suture tape sutures with the Arthrex scorpion.  This created 2 separate horizontal mattresses.  We then loaded all 4 limbs of this into a knotless 4.75 mm swivel lock anchor.  We biologically prepared the greater tuberosity footprint.  We then placed the 4.75 mm anchor into the lateral row.  This had excellent tension.  We used the knotless mechanism to repair a dogear in the posterior leaflet.  This moved as a unit when inspected.  Lastly, we moved to the distal clavicle resection.  While viewing from the lateral portal and working from the mid glenoid portal we prepared the distal clavicle with radiofrequency wand.  We then used the bur to resect approximately 1 cm of the distal clavicle.  Care was taken to preserve the superior and posterior capsular ligaments.  The arthroscope was then removed and portals closed with 4-0 Monocryl in standard fashion followed by a sterile occlusive dressing Polar Care ice sleeve and a slingshot sling. The patient was sent to recovery in stable condition and tolerated the procedure well  POSTOPERATIVE PLAN:  The arm will be maintained in a sling for the first month after surgery with the abduction pillow.  Physical therapy can remove as tolerated for therapy.  They will follow along the less than 3 cm rotator cuff repair protocol.  We will see Daniel Walter back in the office in 2 weeks for routine regular postoperative care.

## 2020-09-22 NOTE — H&P (Signed)
ORTHOPAEDIC H and P  REQUESTING PHYSICIAN: Yolonda Kida, MD  PCP:  Jarrett Soho, PA-C  Chief Complaint: Right shoulder pain and weakness  HPI: Daniel Walter is a 65 y.o. male who complains of right shoulder pain and weakness consistent with rotator cuff tear.  Here today for arthroscopic management.  No new complaints.  Past Medical History:  Diagnosis Date   Anxiety    CAD (coronary artery disease)    Coronary artery disease    ED (erectile dysfunction)    GERD (gastroesophageal reflux disease)    Hypertension    Hypothyroidism    MI (myocardial infarction) (HCC)    Myalgia due to statin    Pre-diabetes    Past Surgical History:  Procedure Laterality Date   APPENDECTOMY     CARDIAC CATHETERIZATION N/A 10/02/2015   Procedure: Left Heart Cath and Coronary Angiography;  Surgeon: Yates Decamp, MD;  Location: [facility] INVASIVE CV LAB;  Service: Cardiovascular;  Laterality: N/A;   CARDIAC CATHETERIZATION N/A 10/02/2015   Procedure: Coronary Stent Intervention;  Surgeon: Yates Decamp, MD;  Location: [facility] INVASIVE CV LAB;  Service: Cardiovascular;  Laterality: N/A;  Prox LAD   CHOLECYSTECTOMY     CORONARY ANGIOPLASTY     KNEE ARTHROSCOPY     Social History   Socioeconomic History   Marital status: Married    Spouse name: Not on file   Number of children: 4   Years of education: Not on file   Highest education level: Not on file  Occupational History   Not on file  Tobacco Use   Smoking status: Never   Smokeless tobacco: Never  Vaping Use   Vaping Use: Never used  Substance and Sexual Activity   Alcohol use: Not Currently    Comment: occ   Drug use: No   Sexual activity: Yes    Birth control/protection: Condom  Other Topics Concern   Not on file  Social History Narrative   One child deceased    Social Determinants of Health   Financial Resource Strain: Not on file  Food Insecurity: Not on file  Transportation Needs: Not on file  Physical Activity: Not on  file  Stress: Not on file  Social Connections: Not on file   Family History  Problem Relation Age of Onset   Heart attack Mother    Aneurysm Father    Allergies  Allergen Reactions   Gluten Meal     Pt preference    Milk-Related Compounds Diarrhea and Other (See Comments)    NO DAIRY; LETHARGY (ALSO)   Msud Aid [Alitraq]     Unknown    Yeast-Related Products     Blood tests    Prior to Admission medications   Medication Sig Start Date End Date Taking? Authorizing Provider  Ascorbic Acid (VITAMIN C) 250 MG CHEW Take 500 mg by mouth in the morning and at bedtime.   Yes [provider]  aspirin 81 MG chewable tablet Chew 1 tablet (81 mg total) by mouth daily. 10/04/15  Yes Marcy Salvo, NP  Barberry-Oreg Grape-Goldenseal (BERBERINE COMPLEX PO) Take 1 capsule by mouth in the morning and at bedtime.   Yes [provider]  carvedilol (COREG) 6.25 MG tablet TAKE 1 TABLET 2 TIMES A DAY WITH MEALS Patient taking differently: Take 6.25 mg by mouth 2 (two) times daily with a meal. 08/25/20  Yes Yates Decamp, MD  Cholecalciferol (VITAMIN D3) 5000 units CAPS Take 5,000-10,000 Units by mouth daily.   Yes [provider]  DHEA 25 MG CAPS Take 25 mg by mouth daily.   Yes [provider]  Digestive Enzymes (DIGESTIVE ENZYME PO) Take 350 mg by mouth daily. Enzyme aid digestive, Natures life   Yes [provider]  ibuprofen (ADVIL) 200 MG tablet Take 400 mg by mouth every 8 (eight) hours as needed for moderate pain.   Yes [provider]  OVER THE COUNTER MEDICATION Take 1 tablet by mouth every other day. Methylation Complete   Yes [provider]  OVER THE COUNTER MEDICATION Take 75 mg by mouth daily. Pregnenolone   Yes [provider]  Probiotic Product (PROBIOTIC ADVANCED PO) Take 1 capsule by mouth in the morning and at bedtime. Mega Sporebiotic   Yes [provider]  sildenafil (REVATIO) 20 MG tablet Take 1 tablet  (20 mg total) by mouth as needed. Patient taking differently: Take 20 mg by mouth as needed (ED). 05/30/20  Yes Cantwell, Celeste C, PA-C  telmisartan (MICARDIS) 80 MG tablet TAKE 1 TABLET DAILY Patient taking differently: Take 80 mg by mouth daily. 07/26/20  Yes Cantwell, Celeste C, PA-C  testosterone cypionate (DEPOTESTOSTERONE CYPIONATE) 200 MG/ML injection Inject 0.7 mLs into the muscle once a week. 07/25/20  Yes [provider]  thyroid (ARMOUR) 60 MG tablet Take 60 mg by mouth daily before breakfast.   Yes [provider]  Zinc 50 MG CAPS Take 50 mg by mouth daily.   Yes [provider]   No results found.  Positive ROS: All other systems have been reviewed and were otherwise negative with the exception of those mentioned in the HPI and as above.  Physical Exam: General: Alert, no acute distress Cardiovascular: No pedal edema Respiratory: No cyanosis, no use of accessory musculature GI: No organomegaly, abdomen is soft and non-tender Skin: No lesions in the area of chief complaint Neurologic: Sensation intact distally Psychiatric: Patient is competent for consent with normal mood and affect Lymphatic: No axillary or cervical lymphadenopathy  MUSCULOSKELETAL:  Right upper extremity is warm and well-perfused.  No open wounds or lesions.  Neurovascular intact.  Assessment: 1.  Right shoulder rotator cuff tear, acute. 2.  Right shoulder acromioclavicular joint arthritis 3.  Right shoulder subacromial impingement 4.  Right shoulder proximal biceps tendinitis  Plan: -Plan to proceed today with right shoulder arthroscopy with rotator cuff repair, distal clavicle resection, extensive debridement with biceps tenotomy and distal clavicle resection with subacromial decompression.  We discussed the risk of bleeding, infection, damage to surrounding nerves and vessels, stiffness, failure of repair, weakness, risk of anesthesia.  He has provided informed  consent.    Yolonda Kida, MD Cell [phone]    09/22/2020 12:27 PM

## 2020-09-22 NOTE — Anesthesia Procedure Notes (Signed)
Procedure Name: Intubation Date/Time: 09/22/2020 12:39 PM Performed by: Maxwell Caul, CRNA Pre-anesthesia Checklist: Patient identified, Emergency Drugs available, Suction available and Patient being monitored Patient Re-evaluated:Patient Re-evaluated prior to induction Oxygen Delivery Method: Circle system utilized Preoxygenation: Pre-oxygenation with 100% oxygen Induction Type: IV induction Ventilation: Mask ventilation without difficulty Laryngoscope Size: Mac and 4 Grade View: Grade I Tube type: Oral Tube size: 7.5 mm Number of attempts: 1 Airway Equipment and Method: Stylet Placement Confirmation: ETT inserted through vocal cords under direct vision, positive ETCO2 and breath sounds checked- equal and bilateral Secured at: 21 cm Tube secured with: Tape Dental Injury: Teeth and Oropharynx as per pre-operative assessment

## 2020-09-22 NOTE — Transfer of Care (Signed)
Immediate Anesthesia Transfer of Care Note  Patient: Daniel Walter  Procedure(s) Performed: Right Shoulder scope rotator cuff repair ,subacromial decompression and distal clavicle resection (Right)  Patient Location: PACU  Anesthesia Type:General  Level of Consciousness: awake, alert  and oriented  Airway & Oxygen Therapy: Patient Spontanous Breathing and Patient connected to face mask oxygen  Post-op Assessment: Report given to RN and Post -op Vital signs reviewed and stable  Post vital signs: Reviewed and stable  Last Vitals:  Vitals Value Taken Time  BP 129/84 09/22/20 1423  Temp    Pulse 96 09/22/20 1425  Resp 20 09/22/20 1425  SpO2 96 % 09/22/20 1425  Vitals shown include unvalidated device data.  Last Pain:  Vitals:   09/22/20 1150  TempSrc: Oral  PainSc: 0-No pain         Complications: No notable events documented.

## 2020-09-22 NOTE — Anesthesia Procedure Notes (Signed)
Anesthesia Regional Block: Interscalene brachial plexus block   Pre-Anesthetic Checklist: , timeout performed,  Correct Patient, Correct Site, Correct Laterality,  Correct Procedure, Correct Position, site marked,  Risks and benefits discussed,  Surgical consent,  Pre-op evaluation,  At surgeon's request and post-op pain management  Laterality: Right and Upper  Prep: chloraprep       Needles:  Injection technique: Single-shot  Needle Type: Echogenic Needle     Needle Length: 9cm  Needle Gauge: 21     Additional Needles:   Procedures:,,,, ultrasound used (permanent image in chart),,    Narrative:  Start time: 09/22/2020 11:52 AM End time: 09/22/2020 11:58 AM Injection made incrementally with aspirations every 5 mL.  Performed by: Personally  Anesthesiologist: Jairo Ben, MD  Additional Notes: Pt identified in Holding room.  Monitors applied. Working IV access confirmed. Sterile prep R clavicle and neck.  #21ga ECHOgenic Arrow block needle to interscalene brachial plexus with US guidance.  10cc 0.5% Bupivacaine with 1:200k epi, exparel injected incrementally after negative test dose.  Patient asymptomatic, VSS, no heme aspirated, tolerated well.   Sandford Craze, MD

## 2020-09-22 NOTE — Progress Notes (Signed)
AssistedDr. Carswell Jackson with right, ultrasound guided, interscalene  block. Side rails up, monitors on throughout procedure. See vital signs in flow sheet. Tolerated Procedure well.  

## 2020-09-25 ENCOUNTER — Other Ambulatory Visit: Payer: Self-pay | Admitting: Student

## 2020-09-25 DIAGNOSIS — E7800 Pure hypercholesterolemia, unspecified: Secondary | ICD-10-CM

## 2020-09-26 ENCOUNTER — Encounter (HOSPITAL_COMMUNITY): Payer: Self-pay | Admitting: Orthopedic Surgery

## 2020-09-29 ENCOUNTER — Ambulatory Visit: Payer: 59 | Attending: Orthopedic Surgery | Admitting: Physical Therapy

## 2020-09-29 ENCOUNTER — Ambulatory Visit: Payer: BC Managed Care – PPO | Admitting: Cardiology

## 2020-09-29 ENCOUNTER — Encounter: Payer: Self-pay | Admitting: Physical Therapy

## 2020-09-29 ENCOUNTER — Other Ambulatory Visit: Payer: Self-pay

## 2020-09-29 DIAGNOSIS — M25611 Stiffness of right shoulder, not elsewhere classified: Secondary | ICD-10-CM | POA: Insufficient documentation

## 2020-09-29 DIAGNOSIS — R600 Localized edema: Secondary | ICD-10-CM | POA: Insufficient documentation

## 2020-09-29 DIAGNOSIS — M25511 Pain in right shoulder: Secondary | ICD-10-CM | POA: Insufficient documentation

## 2020-09-29 DIAGNOSIS — G8929 Other chronic pain: Secondary | ICD-10-CM | POA: Diagnosis present

## 2020-09-29 NOTE — Therapy (Signed)
[facility] [address] [place], [place], [address] Phone: [phone]   Fax:  [phone]  Physical Therapy Evaluation  Patient Details  Name: Daniel Walter MRN: [id number] Date of Birth: Mar 23, 1955 Referring Provider (PT): Duwayne Heck MD   Encounter Date: 09/29/2020   PT End of Session - 09/29/20 1235     Visit Number 1    Number of Visits 8    Date for PT Re-Evaluation 10/27/20    Authorization Type FOTO AT LEAST EVERY 5TH VISIT.  PROGRESS NOTE AT 10TH VISIT.  KX MODIFIER AFTER 15 VISITS.    PT Start Time 0900    PT Stop Time 0945    PT Time Calculation (min) 45 min    Activity Tolerance Patient tolerated treatment well    Behavior During Therapy WFL for tasks assessed/performed             Past Medical History:  Diagnosis Date   Anxiety    CAD (coronary artery disease)    Coronary artery disease    ED (erectile dysfunction)    GERD (gastroesophageal reflux disease)    Hypertension    Hypothyroidism    MI (myocardial infarction) (HCC)    Myalgia due to statin    Pre-diabetes     Past Surgical History:  Procedure Laterality Date   APPENDECTOMY     CARDIAC CATHETERIZATION N/A 10/02/2015   Procedure: Left Heart Cath and Coronary Angiography;  Surgeon: Yates Decamp, MD;  Location: [facility] INVASIVE CV LAB;  Service: Cardiovascular;  Laterality: N/A;   CARDIAC CATHETERIZATION N/A 10/02/2015   Procedure: Coronary Stent Intervention;  Surgeon: Yates Decamp, MD;  Location: [facility] INVASIVE CV LAB;  Service: Cardiovascular;  Laterality: N/A;  Prox LAD   CHOLECYSTECTOMY     CORONARY ANGIOPLASTY     KNEE ARTHROSCOPY     SHOULDER ARTHROSCOPY WITH ROTATOR CUFF REPAIR Right 09/22/2020   Procedure: Right Shoulder scope rotator cuff repair ,subacromial decompression and distal clavicle resection;  Surgeon: Yolonda Kida, MD;  Location: [facility];  Service: Orthopedics;  Laterality: Right;     There were no vitals filed for this  visit.    Subjective Assessment - 09/29/20 1056     Subjective COVID-19 screen performed prior to patient entering clinic.  The patient presents to the clinic s/p right shoulder DCR, RCR and SAD performed on 09/22/20.  His pain-level today atrest is a low 1/10.  He is compliant to using his right shoulder sling with abduction pillow.    Pertinent History MI, CAD, "silent" migraines.    Patient Stated Goals Use right UE without pain.    Currently in Pain? Yes    Pain Score 1     Pain Location Shoulder    Pain Orientation Right    Pain Descriptors / Indicators Aching    Pain Type Surgical pain    Pain Onset In the past 7 days    Pain Frequency Occasional    Aggravating Factors  Movement.    Pain Relieving Factors Rest                [facility] PT Assessment - 09/29/20 0001       Assessment   Medical Diagnosis Right shoulder RCR, SAD, DCR    Referring Provider (PT) Duwayne Heck MD    Onset Date/Surgical Date 09/22/20      Precautions   Precaution Comments Begin with gentle right shoulder PROM.      Restrictions   Other Position/Activity Restrictions  No right UE weight bearing.      Balance Screen   Has the patient fallen in the past 6 months No    Has the patient had a decrease in activity level because of a fear of falling?  Yes    Is the patient reluctant to leave their home because of a fear of falling?  No      Home Environment   Living Environment Private residence      Prior Function   Level of Independence Independent      Observation/Other Assessments   Observations Steri-strips intact with clear bandaid over them that wife placed per dr office okay.    Focus on Therapeutic Outcomes (FOTO)  Complete.      Observation/Other Assessments-Edema    Edema --   Min+ edema noted about patient's right shoulder.     ROM / Strength   AROM / PROM / Strength PROM      PROM   Overall PROM Comments In supine:  Patient's passive right shoulder flexion to 75 degrees and ER  to -10 degrees.                        Objective measurements completed on examination: See above findings.       [facility] Adult PT Treatment/Exercise - 09/29/20 0001       Modalities   Modalities Vasopneumatic      Vasopneumatic   Number Minutes Vasopneumatic  15 minutes    Vasopnuematic Location  --   Right shoulder with pillow between right elbow and thorax.   Vasopneumatic Pressure Low                                 Plan - 09/29/20 1220     Clinical Impression Statement The patient presents to OPPT s/p right RCR, DCR and SAD performed on 09/22/20.  He presented to the clinic today with very little pain. His passive range of motion is very limited currently.  He has some minimal+ edema about his right shoulder.  He was instructed in a gentle passive ER stretch with cane to be performed in supine an did very well with excellent technique  .Patient will benefit from skilled physical therapy intervention to address pain and deficits.    Personal Factors and Comorbidities Comorbidity 1;Comorbidity 2;Other    Examination-Activity Limitations Other;Reach Overhead    Examination-Participation Restrictions Other    Stability/Clinical Decision Making Stable/Uncomplicated    Clinical Decision Making Low    Rehab Potential Excellent    PT Frequency 2x / week    PT Duration 4 weeks    PT Treatment/Interventions ADLs/Self Care Home Management;Cryotherapy;Electrical Stimulation;Ultrasound;Moist Heat;Functional mobility training;Therapeutic activities;Therapeutic exercise;Neuromuscular re-education;Manual techniques;Patient/family education;Passive range of motion;Vasopneumatic Device    PT Next Visit Plan begin with PROM to patient's right shoulder.  Pendulum, supine passive cane stretch to increase ER and passive countertop stretch to improve flexion for HEP.             Patient will benefit from skilled therapeutic intervention in order to improve  the following deficits and impairments:  Decreased activity tolerance, Pain, Decreased range of motion, Increased edema  Visit Diagnosis: Chronic right shoulder pain - Plan: PT plan of care cert/re-cert  Stiffness of right shoulder, not elsewhere classified - Plan: PT plan of care cert/re-cert  Localized edema - Plan: PT plan of care cert/re-cert     Problem  List Patient Active Problem List   Diagnosis Date Noted   Syncope and collapse 01/29/2020   Diarrhea 01/29/2020   Dehydration 01/29/2020   Hypoalbuminemia 01/29/2020   Hyperglycemia 01/29/2020   Leukocytosis 01/29/2020   Hypercholesteremia 05/01/2018   Essential hypertension 05/01/2018   Myalgia due to statin 05/01/2018   History of MI (myocardial infarction) 05/01/2018   MI (myocardial infarction) [facility] [place])    CAD (coronary artery disease)    ED (erectile dysfunction)    CAD (coronary artery disease), native coronary artery 10/02/2015    Jette Lewan, [place] MPT 09/29/2020, 12:37 PM  [facility] [address] [place], [place], [address] Phone: [phone]   Fax:  [phone]  Name: Daniel Walter MRN: [id number] Date of Birth: 10-18-1955

## 2020-10-03 ENCOUNTER — Ambulatory Visit: Payer: 59 | Admitting: Student

## 2020-10-04 ENCOUNTER — Other Ambulatory Visit: Payer: Self-pay | Admitting: Student

## 2020-10-06 ENCOUNTER — Ambulatory Visit: Payer: 59 | Admitting: Physical Therapy

## 2020-10-06 ENCOUNTER — Telehealth: Payer: Self-pay | Admitting: Physical Therapy

## 2020-10-06 NOTE — Telephone Encounter (Signed)
Wife called to cx - Daniel Walter had his BP drop during the night and he is not feeling well today. He will be here next Friday

## 2020-10-13 ENCOUNTER — Other Ambulatory Visit: Payer: Self-pay

## 2020-10-13 ENCOUNTER — Ambulatory Visit: Payer: 59 | Attending: Orthopedic Surgery | Admitting: *Deleted

## 2020-10-13 DIAGNOSIS — M25611 Stiffness of right shoulder, not elsewhere classified: Secondary | ICD-10-CM | POA: Diagnosis present

## 2020-10-13 DIAGNOSIS — M25511 Pain in right shoulder: Secondary | ICD-10-CM | POA: Diagnosis present

## 2020-10-13 DIAGNOSIS — G8929 Other chronic pain: Secondary | ICD-10-CM | POA: Insufficient documentation

## 2020-10-13 DIAGNOSIS — R600 Localized edema: Secondary | ICD-10-CM | POA: Diagnosis present

## 2020-10-13 NOTE — Therapy (Signed)
[facility] [address] [place], [place], [address] Phone: [phone]   Fax:  [phone]  Physical Therapy Treatment  Patient Details  Name: Daniel Walter MRN: [id number] Date of Birth: February 19, 1956 Referring Provider (PT): Duwayne Heck MD   Encounter Date: 10/13/2020   PT End of Session - 10/13/20 0905     Visit Number 2    Number of Visits 8    Date for PT Re-Evaluation 10/27/20    Authorization Type FOTO AT LEAST EVERY 5TH VISIT.  PROGRESS NOTE AT 10TH VISIT.  KX MODIFIER AFTER 15 VISITS.    PT Start Time 0900    PT Stop Time 0950    PT Time Calculation (min) 50 min             Past Medical History:  Diagnosis Date   Anxiety    CAD (coronary artery disease)    Coronary artery disease    ED (erectile dysfunction)    GERD (gastroesophageal reflux disease)    Hypertension    Hypothyroidism    MI (myocardial infarction) (HCC)    Myalgia due to statin    Pre-diabetes     Past Surgical History:  Procedure Laterality Date   APPENDECTOMY     CARDIAC CATHETERIZATION N/A 10/02/2015   Procedure: Left Heart Cath and Coronary Angiography;  Surgeon: Yates Decamp, MD;  Location: [facility] INVASIVE CV LAB;  Service: Cardiovascular;  Laterality: N/A;   CARDIAC CATHETERIZATION N/A 10/02/2015   Procedure: Coronary Stent Intervention;  Surgeon: Yates Decamp, MD;  Location: [facility] INVASIVE CV LAB;  Service: Cardiovascular;  Laterality: N/A;  Prox LAD   CHOLECYSTECTOMY     CORONARY ANGIOPLASTY     KNEE ARTHROSCOPY     SHOULDER ARTHROSCOPY WITH ROTATOR CUFF REPAIR Right 09/22/2020   Procedure: Right Shoulder scope rotator cuff repair ,subacromial decompression and distal clavicle resection;  Surgeon: Yolonda Kida, MD;  Location: [facility];  Service: Orthopedics;  Laterality: Right;     There were no vitals filed for this visit.   Subjective Assessment - 10/13/20 0901     Subjective COVID-19 screen performed prior to patient entering  clinic. Fell at home, but doing ok    Pertinent History MI, CAD, "silent" migraines.    Patient Stated Goals Use right UE without pain.    Currently in Pain? Yes    Pain Score 1     Pain Location Shoulder    Pain Orientation Right    Pain Descriptors / Indicators Aching    Pain Type Surgical pain    Pain Onset In the past 7 days                               [facility] Adult PT Treatment/Exercise - 10/13/20 0001       Modalities   Modalities Vasopneumatic      Vasopneumatic   Number Minutes Vasopneumatic  15 minutes    Vasopnuematic Location  --   Right shoulder with pillow between right elbow and thorax.   Vasopneumatic Pressure Low    Vasopneumatic Temperature  34      Manual Therapy   Manual Therapy Passive ROM    Passive ROM PROM to RT shldr for flexion to 90 degrees and ER to 20 degrees today all in supine  Plan - 10/13/20 0941     Clinical Impression Statement Pt arrived today reporting of falling at home ,but doing ok. PROM for flexion and ER. Flexion to 90 degrees and ER to 20 degrees today. Normal vaso end of RX    Personal Factors and Comorbidities Comorbidity 1;Comorbidity 2;Other    Examination-Activity Limitations Other;Reach Overhead    Examination-Participation Restrictions Other    Stability/Clinical Decision Making Stable/Uncomplicated    Rehab Potential Excellent    PT Frequency 2x / week    PT Duration 4 weeks    PT Treatment/Interventions ADLs/Self Care Home Management;Cryotherapy;Electrical Stimulation;Ultrasound;Moist Heat;Functional mobility training;Therapeutic activities;Therapeutic exercise;Neuromuscular re-education;Manual techniques;Patient/family education;Passive range of motion;Vasopneumatic Device    PT Next Visit Plan begin with PROM to patient's right shoulder.  Pendulum, supine passive cane stretch to increase ER and passive countertop stretch to improve flexion for HEP. See  Protocol    Consulted and Agree with Plan of Care Patient             Patient will benefit from skilled therapeutic intervention in order to improve the following deficits and impairments:  Decreased activity tolerance, Pain, Decreased range of motion, Increased edema  Visit Diagnosis: Chronic right shoulder pain  Stiffness of right shoulder, not elsewhere classified  Localized edema     Problem List Patient Active Problem List   Diagnosis Date Noted   Syncope and collapse 01/29/2020   Diarrhea 01/29/2020   Dehydration 01/29/2020   Hypoalbuminemia 01/29/2020   Hyperglycemia 01/29/2020   Leukocytosis 01/29/2020   Hypercholesteremia 05/01/2018   Essential hypertension 05/01/2018   Myalgia due to statin 05/01/2018   History of MI (myocardial infarction) 05/01/2018   MI (myocardial infarction) [facility])    CAD (coronary artery disease)    ED (erectile dysfunction)    CAD (coronary artery disease), native coronary artery 10/02/2015    Daniel Walter,Daniel Walter, PTA 10/13/2020, 9:57 AM  [facility] [address] [place], [place], [address] Phone: [phone]   Fax:  [phone]  Name: Daniel Walter MRN: [id number] Date of Birth: [birth year]

## 2020-10-18 ENCOUNTER — Ambulatory Visit: Payer: 59 | Admitting: Physical Therapy

## 2020-10-18 ENCOUNTER — Encounter: Payer: Self-pay | Admitting: Physical Therapy

## 2020-10-18 ENCOUNTER — Other Ambulatory Visit: Payer: Self-pay

## 2020-10-18 DIAGNOSIS — M25511 Pain in right shoulder: Secondary | ICD-10-CM | POA: Diagnosis not present

## 2020-10-18 DIAGNOSIS — G8929 Other chronic pain: Secondary | ICD-10-CM

## 2020-10-18 DIAGNOSIS — R600 Localized edema: Secondary | ICD-10-CM

## 2020-10-18 DIAGNOSIS — M25611 Stiffness of right shoulder, not elsewhere classified: Secondary | ICD-10-CM

## 2020-10-18 NOTE — Therapy (Signed)
[facility] [address] [place], [place], [address] Phone: [phone]   Fax:  [phone]  Physical Therapy Treatment  Patient Details  Name: Daniel Walter MRN: [id number] Date of Birth: 1955-07-06 Referring Provider (PT): Duwayne Heck MD   Encounter Date: 10/18/2020   PT End of Session - 10/18/20 0903     Visit Number 3    Number of Visits 8    Date for PT Re-Evaluation 10/27/20    Authorization Type FOTO AT LEAST EVERY 5TH VISIT.  PROGRESS NOTE AT 10TH VISIT.  KX MODIFIER AFTER 15 VISITS.    PT Start Time 0903    PT Stop Time 0944    PT Time Calculation (min) 41 min    Activity Tolerance Patient tolerated treatment well    Behavior During Therapy WFL for tasks assessed/performed             Past Medical History:  Diagnosis Date   Anxiety    CAD (coronary artery disease)    Coronary artery disease    ED (erectile dysfunction)    GERD (gastroesophageal reflux disease)    Hypertension    Hypothyroidism    MI (myocardial infarction) (HCC)    Myalgia due to statin    Pre-diabetes     Past Surgical History:  Procedure Laterality Date   APPENDECTOMY     CARDIAC CATHETERIZATION N/A 10/02/2015   Procedure: Left Heart Cath and Coronary Angiography;  Surgeon: Yates Decamp, MD;  Location: [facility] INVASIVE CV LAB;  Service: Cardiovascular;  Laterality: N/A;   CARDIAC CATHETERIZATION N/A 10/02/2015   Procedure: Coronary Stent Intervention;  Surgeon: Yates Decamp, MD;  Location: [facility] INVASIVE CV LAB;  Service: Cardiovascular;  Laterality: N/A;  Prox LAD   CHOLECYSTECTOMY     CORONARY ANGIOPLASTY     KNEE ARTHROSCOPY     SHOULDER ARTHROSCOPY WITH ROTATOR CUFF REPAIR Right 09/22/2020   Procedure: Right Shoulder scope rotator cuff repair ,subacromial decompression and distal clavicle resection;  Surgeon: Yolonda Kida, MD;  Location: [facility];  Service: Orthopedics;  Laterality: Right;     There were no vitals filed for this  visit.   Subjective Assessment - 10/18/20 0902     Subjective COVID-19 screen performed prior to patient entering clinic. Reports tiresome and took a shower this morning. Some discomfort and soreness intermittantly.    Pertinent History MI, CAD, "silent" migraines.    Patient Stated Goals Use right UE without pain.    Currently in Pain? No/denies                [facility] PT Assessment - 10/18/20 0001       Assessment   Medical Diagnosis Right shoulder RCR, SAD, DCR    Referring Provider (PT) Duwayne Heck MD    Onset Date/Surgical Date 09/22/20      Precautions   Precaution Comments Begin with gentle right shoulder PROM.                           [facility] Adult PT Treatment/Exercise - 10/18/20 0001       Exercises   Exercises Shoulder      Modalities   Modalities Vasopneumatic      Vasopneumatic   Number Minutes Vasopneumatic  10 minutes    Vasopnuematic Location  Shoulder    Vasopneumatic Pressure Low    Vasopneumatic Temperature  34      Manual Therapy   Manual Therapy Passive ROM  Passive ROM PROM to RT shldr for flexion to 90 degrees and ER to 20 degrees today all in supine                                Plan - 10/18/20 1219     Clinical Impression Statement Patient presented in clinic with R shoulder sling donned. Patient reports he will be happy to be rid of the abductor pillow when permissable. Patient able to tolerate PROM well within protocol limitations. Patient required assist from supine to sit to avoid pressure on RUE. Smooth arc of motion noted at this time during PROM. Normal vasopneumatic response noted following removal of the modality.    Personal Factors and Comorbidities Comorbidity 1;Comorbidity 2;Other    Examination-Activity Limitations Other;Reach Overhead    Examination-Participation Restrictions Other    Stability/Clinical Decision Making Stable/Uncomplicated    Rehab Potential Excellent    PT  Frequency 2x / week    PT Duration 4 weeks    PT Treatment/Interventions ADLs/Self Care Home Management;Cryotherapy;Electrical Stimulation;Ultrasound;Moist Heat;Functional mobility training;Therapeutic activities;Therapeutic exercise;Neuromuscular re-education;Manual techniques;Patient/family education;Passive range of motion;Vasopneumatic Device    PT Next Visit Plan begin with PROM to patient's right shoulder.  Pendulum, supine passive cane stretch to increase ER and passive countertop stretch to improve flexion for HEP. See Protocol    Consulted and Agree with Plan of Care Patient             Patient will benefit from skilled therapeutic intervention in order to improve the following deficits and impairments:  Decreased activity tolerance, Pain, Decreased range of motion, Increased edema  Visit Diagnosis: Chronic right shoulder pain  Stiffness of right shoulder, not elsewhere classified  Localized edema     Problem List Patient Active Problem List   Diagnosis Date Noted   Syncope and collapse 01/29/2020   Diarrhea 01/29/2020   Dehydration 01/29/2020   Hypoalbuminemia 01/29/2020   Hyperglycemia 01/29/2020   Leukocytosis 01/29/2020   Hypercholesteremia 05/01/2018   Essential hypertension 05/01/2018   Myalgia due to statin 05/01/2018   History of MI (myocardial infarction) 05/01/2018   MI (myocardial infarction) [facility])    CAD (coronary artery disease)    ED (erectile dysfunction)    CAD (coronary artery disease), native coronary artery 10/02/2015    Marvell Fuller, PTA 10/18/2020, 12:22 PM  [facility] [address] [place], [place], [address] Phone: [phone]   Fax:  [phone]  Name: Daniel Walter MRN: [id number] Date of Birth: 02-14-1956

## 2020-10-25 ENCOUNTER — Other Ambulatory Visit: Payer: Self-pay

## 2020-10-25 ENCOUNTER — Encounter: Payer: Self-pay | Admitting: Physical Therapy

## 2020-10-25 ENCOUNTER — Ambulatory Visit: Payer: 59 | Admitting: Physical Therapy

## 2020-10-25 DIAGNOSIS — G8929 Other chronic pain: Secondary | ICD-10-CM

## 2020-10-25 DIAGNOSIS — M25511 Pain in right shoulder: Secondary | ICD-10-CM | POA: Diagnosis not present

## 2020-10-25 DIAGNOSIS — R600 Localized edema: Secondary | ICD-10-CM

## 2020-10-25 DIAGNOSIS — M25611 Stiffness of right shoulder, not elsewhere classified: Secondary | ICD-10-CM

## 2020-10-25 NOTE — Therapy (Signed)
[facility] [address] [place], [place], [address] Phone: [phone]   Fax:  [phone]  Physical Therapy Treatment  Patient Details  Name: Daniel Walter MRN: [id number] Date of Birth: 07-22-1955 Referring Provider (PT): Duwayne Heck MD   Encounter Date: 10/25/2020   PT End of Session - 10/25/20 0954     Visit Number 4    Number of Visits 8    Date for PT Re-Evaluation 10/27/20    Authorization Type FOTO AT LEAST EVERY 5TH VISIT.  PROGRESS NOTE AT 10TH VISIT.  KX MODIFIER AFTER 15 VISITS.    PT Start Time [phone]    PT Stop Time 1035    PT Time Calculation (min) 41 min    Activity Tolerance Patient tolerated treatment well    Behavior During Therapy WFL for tasks assessed/performed             Past Medical History:  Diagnosis Date   Anxiety    CAD (coronary artery disease)    Coronary artery disease    ED (erectile dysfunction)    GERD (gastroesophageal reflux disease)    Hypertension    Hypothyroidism    MI (myocardial infarction) (HCC)    Myalgia due to statin    Pre-diabetes     Past Surgical History:  Procedure Laterality Date   APPENDECTOMY     CARDIAC CATHETERIZATION N/A 10/02/2015   Procedure: Left Heart Cath and Coronary Angiography;  Surgeon: Yates Decamp, MD;  Location: [facility] INVASIVE CV LAB;  Service: Cardiovascular;  Laterality: N/A;   CARDIAC CATHETERIZATION N/A 10/02/2015   Procedure: Coronary Stent Intervention;  Surgeon: Yates Decamp, MD;  Location: [facility] INVASIVE CV LAB;  Service: Cardiovascular;  Laterality: N/A;  Prox LAD   CHOLECYSTECTOMY     CORONARY ANGIOPLASTY     KNEE ARTHROSCOPY     SHOULDER ARTHROSCOPY WITH ROTATOR CUFF REPAIR Right 09/22/2020   Procedure: Right Shoulder scope rotator cuff repair ,subacromial decompression and distal clavicle resection;  Surgeon: Yolonda Kida, MD;  Location: [facility];  Service: Orthopedics;  Laterality: Right;     There were no vitals filed for this  visit.   Subjective Assessment - 10/25/20 0938     Subjective COVID-19 screen performed prior to patient entering clinic.    Pertinent History MI, CAD, "silent" migraines.    Patient Stated Goals Use right UE without pain.    Currently in Pain? No/denies                [facility] PT Assessment - 10/25/20 0001       Assessment   Medical Diagnosis Right shoulder RCR, SAD, DCR    Referring Provider (PT) Duwayne Heck MD    Onset Date/Surgical Date 09/22/20      Precautions   Precaution Comments Begin with gentle right shoulder PROM.                           [facility] Adult PT Treatment/Exercise - 10/25/20 0001       Modalities   Modalities Vasopneumatic      Vasopneumatic   Number Minutes Vasopneumatic  10 minutes    Vasopnuematic Location  Shoulder    Vasopneumatic Pressure Low    Vasopneumatic Temperature  34      Manual Therapy   Manual Therapy Passive ROM    Passive ROM PROM to RT shldr for flexion to 90 degrees and ER to 20 degrees today all in supine  PT Long Term Goals - 10/19/20 1619       PT LONG TERM GOAL #1   Title Independent with a HEP.    Time 4    Period Weeks    Status New      PT LONG TERM GOAL #2   Title Active right shoulder flexion to 145 degrees so the patient can easily reach overhead.    Time 4    Period Weeks    Status New      PT LONG TERM GOAL #3   Title Active ER to 70 degrees+ to allow for easily donning/doffing of apparel.    Time 4    Period Weeks    Status New      PT LONG TERM GOAL #4   Title Perform ADL's with pain not > 3/10.    Time 4    Period Weeks    Status New      PT LONG TERM GOAL #5   Title Increase right shoulder strength to a solid 4+/5 to increase stability for performance of functional activities.    Time 4    Period Weeks    Status New                   Plan - 10/25/20 1108     Clinical Impression Statement Patient presented in clinic  with no R shoulder pain. Patient compliant with sling use as it was donned upon arrival. Patient sleeping in recliner at this time for comfort. Firm end feels and smooth arc of motion noted during PROM of R shoulder. More tightness noted in R shoulder ER. Normal vasopneumatic response noted following removal of the modality.    Personal Factors and Comorbidities Comorbidity 1;Comorbidity 2;Other    Examination-Activity Limitations Other;Reach Overhead    Examination-Participation Restrictions Other    Stability/Clinical Decision Making Stable/Uncomplicated    Rehab Potential Excellent    PT Frequency 2x / week    PT Duration 4 weeks    PT Treatment/Interventions ADLs/Self Care Home Management;Cryotherapy;Electrical Stimulation;Ultrasound;Moist Heat;Functional mobility training;Therapeutic activities;Therapeutic exercise;Neuromuscular re-education;Manual techniques;Patient/family education;Passive range of motion;Vasopneumatic Device    PT Next Visit Plan begin with PROM to patient's right shoulder.  Pendulum, supine passive cane stretch to increase ER and passive countertop stretch to improve flexion for HEP. See Protocol    Consulted and Agree with Plan of Care Patient             Patient will benefit from skilled therapeutic intervention in order to improve the following deficits and impairments:  Decreased activity tolerance, Pain, Decreased range of motion, Increased edema  Visit Diagnosis: Chronic right shoulder pain  Stiffness of right shoulder, not elsewhere classified  Localized edema     Problem List Patient Active Problem List   Diagnosis Date Noted   Syncope and collapse 01/29/2020   Diarrhea 01/29/2020   Dehydration 01/29/2020   Hypoalbuminemia 01/29/2020   Hyperglycemia 01/29/2020   Leukocytosis 01/29/2020   Hypercholesteremia 05/01/2018   Essential hypertension 05/01/2018   Myalgia due to statin 05/01/2018   History of MI (myocardial infarction) 05/01/2018    MI (myocardial infarction) [facility])    CAD (coronary artery disease)    ED (erectile dysfunction)    CAD (coronary artery disease), native coronary artery 10/02/2015    Marvell Fuller, PTA 10/25/2020, 11:12 AM  [facility] [address] [place], [place], [address] Phone: [phone]   Fax:  [phone]  Name: Daniel Walter MRN: [id number] Date of  Birth: 1955-06-12

## 2020-11-03 ENCOUNTER — Ambulatory Visit: Payer: 59 | Attending: Orthopedic Surgery

## 2020-11-03 ENCOUNTER — Other Ambulatory Visit: Payer: Self-pay

## 2020-11-03 DIAGNOSIS — G8929 Other chronic pain: Secondary | ICD-10-CM

## 2020-11-03 DIAGNOSIS — M25611 Stiffness of right shoulder, not elsewhere classified: Secondary | ICD-10-CM | POA: Diagnosis present

## 2020-11-03 DIAGNOSIS — M25511 Pain in right shoulder: Secondary | ICD-10-CM | POA: Insufficient documentation

## 2020-11-03 DIAGNOSIS — R600 Localized edema: Secondary | ICD-10-CM | POA: Diagnosis present

## 2020-11-03 NOTE — Therapy (Signed)
[facility] [address] [place], [place], [address] Phone: [phone]   Fax:  [phone]  Physical Therapy Treatment  Patient Details  Name: Daniel Walter MRN: [id number] Date of Birth: [birth year] Referring Provider (PT): Duwayne Heck MD   Encounter Date: 11/03/2020   PT End of Session - 11/03/20 1132     Visit Number 5    Number of Visits 8    Date for PT Re-Evaluation 10/27/20    Authorization Type FOTO AT LEAST EVERY 5TH VISIT.  PROGRESS NOTE AT 10TH VISIT.  KX MODIFIER AFTER 15 VISITS.    PT Start Time 1030    PT Stop Time 1125    PT Time Calculation (min) 55 min    Activity Tolerance Patient tolerated treatment well    Behavior During Therapy WFL for tasks assessed/performed             Past Medical History:  Diagnosis Date   Anxiety    CAD (coronary artery disease)    Coronary artery disease    ED (erectile dysfunction)    GERD (gastroesophageal reflux disease)    Hypertension    Hypothyroidism    MI (myocardial infarction) (HCC)    Myalgia due to statin    Pre-diabetes     Past Surgical History:  Procedure Laterality Date   APPENDECTOMY     CARDIAC CATHETERIZATION N/A 10/02/2015   Procedure: Left Heart Cath and Coronary Angiography;  Surgeon: Yates Decamp, MD;  Location: [facility] INVASIVE CV LAB;  Service: Cardiovascular;  Laterality: N/A;   CARDIAC CATHETERIZATION N/A 10/02/2015   Procedure: Coronary Stent Intervention;  Surgeon: Yates Decamp, MD;  Location: [facility] INVASIVE CV LAB;  Service: Cardiovascular;  Laterality: N/A;  Prox LAD   CHOLECYSTECTOMY     CORONARY ANGIOPLASTY     KNEE ARTHROSCOPY     SHOULDER ARTHROSCOPY WITH ROTATOR CUFF REPAIR Right 09/22/2020   Procedure: Right Shoulder scope rotator cuff repair ,subacromial decompression and distal clavicle resection;  Surgeon: Yolonda Kida, MD;  Location: [facility];  Service: Orthopedics;  Laterality: Right;     There were no vitals filed for this  visit.   Subjective Assessment - 11/03/20 1116     Subjective COVID-19 screen performed prior to patient entering clinic. He felt a mild soreness this morning but has been trying to use it as miuch as possible.    Pertinent History MI, CAD, "silent" migraines.    Patient Stated Goals Use right UE without pain.    Pain Score 1     Pain Location Shoulder                  [facility] Adult PT Treatment/Exercise - 11/03/20 0001       Shoulder Exercises: Supine   Other Supine Exercises cane stretch ER/and scapton x 10 at 5 sec hold      Shoulder Exercises: ROM/Strengthening   UBE (Upper Arm Bike) 6 mins fwd      Modalities   Modalities Vasopneumatic      Vasopneumatic   Number Minutes Vasopneumatic  10 minutes    Vasopnuematic Location  Shoulder    Vasopneumatic Pressure Low    Vasopneumatic Temperature  34      Manual Therapy   Manual Therapy Passive ROM;Joint mobilization;Soft tissue mobilization    Joint Mobilization ER AP GHJ mob gr III  in supine; PROM flexion with inferior mob of GHJ    Soft tissue mobilization pectoral STM,    Passive ROM Supine  ER, scaption, and flexion                         PT Long Term Goals - 10/19/20 1619       PT LONG TERM GOAL #1   Title Independent with a HEP.    Time 4    Period Weeks    Status New      PT LONG TERM GOAL #2   Title Active right shoulder flexion to 145 degrees so the patient can easily reach overhead.    Time 4    Period Weeks    Status New      PT LONG TERM GOAL #3   Title Active ER to 70 degrees+ to allow for easily donning/doffing of apparel.    Time 4    Period Weeks    Status New      PT LONG TERM GOAL #4   Title Perform ADL's with pain not > 3/10.    Time 4    Period Weeks    Status New      PT LONG TERM GOAL #5   Title Increase right shoulder strength to a solid 4+/5 to increase stability for performance of functional activities.    Time 4    Period Weeks    Status New                    Plan - 11/03/20 1134     Clinical Impression Statement Patient demonstrates improved ER with manual therapy. Patient with normal response to vasopneumatic device. He tolerates AAROM into flexion and scaption without pain. SkilledPT recommended to continue with goals to achieve functional ROM and strenght.    Personal Factors and Comorbidities Comorbidity 1;Comorbidity 2;Other    Comorbidities MI, CAD, "silent" migraines.    Examination-Activity Limitations Other;Reach Overhead    Examination-Participation Restrictions Other    Stability/Clinical Decision Making Stable/Uncomplicated    Clinical Decision Making Low    Rehab Potential Excellent    PT Frequency 2x / week    PT Duration 4 weeks    PT Treatment/Interventions ADLs/Self Care Home Management;Cryotherapy;Electrical Stimulation;Ultrasound;Moist Heat;Functional mobility training;Therapeutic activities;Therapeutic exercise;Neuromuscular re-education;Manual techniques;Patient/family education;Passive range of motion;Vasopneumatic Device    PT Next Visit Plan begin with PROM to patient's right shoulder.  Pendulum, supine passive cane stretch to increase ER and passive countertop stretch to improve flexion for HEP. See Protocol             Patient will benefit from skilled therapeutic intervention in order to improve the following deficits and impairments:  Decreased activity tolerance, Pain, Decreased range of motion, Increased edema  Visit Diagnosis: Chronic right shoulder pain  Stiffness of right shoulder, not elsewhere classified  Localized edema     Problem List Patient Active Problem List   Diagnosis Date Noted   Syncope and collapse 01/29/2020   Diarrhea 01/29/2020   Dehydration 01/29/2020   Hypoalbuminemia 01/29/2020   Hyperglycemia 01/29/2020   Leukocytosis 01/29/2020   Hypercholesteremia 05/01/2018   Essential hypertension 05/01/2018   Myalgia due to statin 05/01/2018   History of MI  (myocardial infarction) 05/01/2018   MI (myocardial infarction) [facility])    CAD (coronary artery disease)    ED (erectile dysfunction)    CAD (coronary artery disease), native coronary artery 10/02/2015    Levonne Spiller PT, DPT 11/03/2020, 11:45 AM  [facility] [address] [place], [place], [address] Phone: [phone]   Fax:  [phone]  Name: KEONI RISINGER MRN: [id number] Date of Birth: [birth year]

## 2020-11-10 ENCOUNTER — Ambulatory Visit: Payer: 59 | Admitting: Physical Therapy

## 2020-11-10 ENCOUNTER — Other Ambulatory Visit: Payer: Self-pay

## 2020-11-10 DIAGNOSIS — M25611 Stiffness of right shoulder, not elsewhere classified: Secondary | ICD-10-CM

## 2020-11-10 DIAGNOSIS — R600 Localized edema: Secondary | ICD-10-CM

## 2020-11-10 DIAGNOSIS — G8929 Other chronic pain: Secondary | ICD-10-CM

## 2020-11-10 DIAGNOSIS — M25511 Pain in right shoulder: Secondary | ICD-10-CM | POA: Diagnosis not present

## 2020-11-10 NOTE — Therapy (Signed)
[facility] [address] [place], [place], [address] Phone: [phone]   Fax:  [phone]  Physical Therapy Treatment  Patient Details  Name: Daniel Walter MRN: [id number] Date of Birth: [birth year] Referring Provider (PT): Duwayne Heck MD   Encounter Date: 11/10/2020   PT End of Session - 11/10/20 0938     Visit Number 6    Number of Visits 8    Date for PT Re-Evaluation 11/17/20    Authorization Type FOTO AT LEAST EVERY 5TH VISIT.  PROGRESS NOTE AT 10TH VISIT.  KX MODIFIER AFTER 15 VISITS.    PT Start Time 0900    PT Stop Time 0946    PT Time Calculation (min) 46 min    Activity Tolerance Patient tolerated treatment well    Behavior During Therapy WFL for tasks assessed/performed             Past Medical History:  Diagnosis Date   Anxiety    CAD (coronary artery disease)    Coronary artery disease    ED (erectile dysfunction)    GERD (gastroesophageal reflux disease)    Hypertension    Hypothyroidism    MI (myocardial infarction) (HCC)    Myalgia due to statin    Pre-diabetes     Past Surgical History:  Procedure Laterality Date   APPENDECTOMY     CARDIAC CATHETERIZATION N/A 10/02/2015   Procedure: Left Heart Cath and Coronary Angiography;  Surgeon: Yates Decamp, MD;  Location: [facility] INVASIVE CV LAB;  Service: Cardiovascular;  Laterality: N/A;   CARDIAC CATHETERIZATION N/A 10/02/2015   Procedure: Coronary Stent Intervention;  Surgeon: Yates Decamp, MD;  Location: [facility] INVASIVE CV LAB;  Service: Cardiovascular;  Laterality: N/A;  Prox LAD   CHOLECYSTECTOMY     CORONARY ANGIOPLASTY     KNEE ARTHROSCOPY     SHOULDER ARTHROSCOPY WITH ROTATOR CUFF REPAIR Right 09/22/2020   Procedure: Right Shoulder scope rotator cuff repair ,subacromial decompression and distal clavicle resection;  Surgeon: Yolonda Kida, MD;  Location: [facility];  Service: Orthopedics;  Laterality: Right;     There were no vitals filed for this  visit.   Subjective Assessment - 11/10/20 0940     Subjective COVID-19 screen performed prior to patient entering clinic.  My grandson jerked my arm back a couple of times and it hurt.  Doing okay now.  Been back to works.    Pertinent History MI, CAD, "silent" migraines.                [facility] PT Assessment - 11/10/20 0001       PROM   Overall PROM Comments Right shoulder passive flexion to 148 degrees and ER to 65 degrees.                           [facility] Adult PT Treatment/Exercise - 11/10/20 0001       Shoulder Exercises: Standing   Other Standing Exercises Corner stretch and wall climb instruct.      Shoulder Exercises: Pulleys   Flexion --   6 minutes.   Other Pulley Exercises UE ranger on wall x 8 minutes into flexion, ER nd circles (CW and CCW).      Modalities   Modalities Vasopneumatic      Vasopneumatic   Number Minutes Vasopneumatic  15 minutes    Vasopnuematic Location  --   RT shoulder with pillow between thorax and elbow.   Vasopneumatic Pressure  Low      Manual Therapy   Manual Therapy Passive ROM    Passive ROM In supine:  PROM x 9 minutes to patient's right shoulder with focus on flexion and ER.                     PT Education - 11/10/20 0935     Education Details Added corner stretch, wall climibs and home pulley system (handout provided) to his HEP.    Person(s) Educated Patient    Methods Explanation;Demonstration;Tactile cues;Handout    Comprehension Verbalized understanding;Returned demonstration;Verbal cues required;Tactile cues required                 PT Long Term Goals - 10/19/20 1619       PT LONG TERM GOAL #1   Title Independent with a HEP.    Time 4    Period Weeks    Status New      PT LONG TERM GOAL #2   Title Active right shoulder flexion to 145 degrees so the patient can easily reach overhead.    Time 4    Period Weeks    Status New      PT LONG TERM GOAL #3   Title Active ER to 70  degrees+ to allow for easily donning/doffing of apparel.    Time 4    Period Weeks    Status New      PT LONG TERM GOAL #4   Title Perform ADL's with pain not > 3/10.    Time 4    Period Weeks    Status New      PT LONG TERM GOAL #5   Title Increase right shoulder strength to a solid 4+/5 to increase stability for performance of functional activities.    Time 4    Period Weeks    Status New                   Plan - 11/10/20 0945     Clinical Impression Statement The patient is making excellent progress.  He states his grandson jerked his arm backward x acouple of time recently but is doing better.  He has been back to work doing Lobbyist work.  His PROM has improved significantly since starting PT.  Added corner stretch, wall climbs and home pulley system (handout provided).    Personal Factors and Comorbidities Comorbidity 1;Comorbidity 2;Other    Comorbidities MI, CAD, "silent" migraines.    Examination-Activity Limitations Other;Reach Overhead    Examination-Participation Restrictions Other    Stability/Clinical Decision Making Stable/Uncomplicated    Rehab Potential Excellent    PT Duration 4 weeks    PT Treatment/Interventions ADLs/Self Care Home Management;Cryotherapy;Electrical Stimulation;Ultrasound;Moist Heat;Functional mobility training;Therapeutic activities;Therapeutic exercise;Neuromuscular re-education;Manual techniques;Patient/family education;Passive range of motion;Vasopneumatic Device    Consulted and Agree with Plan of Care Patient             Patient will benefit from skilled therapeutic intervention in order to improve the following deficits and impairments:  Decreased activity tolerance, Pain, Decreased range of motion, Increased edema  Visit Diagnosis: Chronic right shoulder pain - Plan: PT plan of care cert/re-cert  Stiffness of right shoulder, not elsewhere classified - Plan: PT plan of care cert/re-cert  Localized edema - Plan: PT plan  of care cert/re-cert     Problem List Patient Active Problem List   Diagnosis Date Noted   Syncope and collapse 01/29/2020   Diarrhea 01/29/2020   Dehydration 01/29/2020  Hypoalbuminemia 01/29/2020   Hyperglycemia 01/29/2020   Leukocytosis 01/29/2020   Hypercholesteremia 05/01/2018   Essential hypertension 05/01/2018   Myalgia due to statin 05/01/2018   History of MI (myocardial infarction) 05/01/2018   MI (my[facility] infarction) [facility])    CAD (coronary artery disease)    ED (erectile dysfunction)    CAD (coronary artery disease), native coronary artery 10/02/2015    Evelio Rueda, [place], PT 11/10/2020, 9:58 AM  [facility] [address] [place], [place], [address] Phone: [phone]   Fax:  [phone]  Name: Daniel Walter MRN: [id number] Date of Birth: [birth year]

## 2020-11-17 ENCOUNTER — Ambulatory Visit: Payer: 59 | Admitting: Physical Therapy

## 2020-11-17 ENCOUNTER — Encounter: Payer: Self-pay | Admitting: Physical Therapy

## 2020-11-17 ENCOUNTER — Other Ambulatory Visit: Payer: Self-pay

## 2020-11-17 DIAGNOSIS — G8929 Other chronic pain: Secondary | ICD-10-CM

## 2020-11-17 DIAGNOSIS — M25511 Pain in right shoulder: Secondary | ICD-10-CM

## 2020-11-17 DIAGNOSIS — M25611 Stiffness of right shoulder, not elsewhere classified: Secondary | ICD-10-CM

## 2020-11-17 DIAGNOSIS — R600 Localized edema: Secondary | ICD-10-CM

## 2020-11-17 NOTE — Therapy (Signed)
[facility] [address] [place], [place], [address] Phone: [phone]   Fax:  [phone]  Physical Therapy Treatment  Patient Details  Name: Daniel Walter MRN: [id number] Date of Birth: [birth year] Referring Provider (PT): Duwayne Heck MD   Encounter Date: 11/17/2020   PT End of Session - 11/17/20 1024     Visit Number 7    Number of Visits 8    Date for PT Re-Evaluation 11/17/20    Authorization Type FOTO AT LEAST EVERY 5TH VISIT.  PROGRESS NOTE AT 10TH VISIT.  KX MODIFIER AFTER 15 VISITS.    PT Start Time [phone]    PT Stop Time 1040    PT Time Calculation (min) 52 min    Activity Tolerance Patient tolerated treatment well    Behavior During Therapy WFL for tasks assessed/performed             Past Medical History:  Diagnosis Date   Anxiety    CAD (coronary artery disease)    Coronary artery disease    ED (erectile dysfunction)    GERD (gastroesophageal reflux disease)    Hypertension    Hypothyroidism    MI (myocardial infarction) (HCC)    Myalgia due to statin    Pre-diabetes     Past Surgical History:  Procedure Laterality Date   APPENDECTOMY     CARDIAC CATHETERIZATION N/A 10/02/2015   Procedure: Left Heart Cath and Coronary Angiography;  Surgeon: Yates Decamp, MD;  Location: [facility] INVASIVE CV LAB;  Service: Cardiovascular;  Laterality: N/A;   CARDIAC CATHETERIZATION N/A 10/02/2015   Procedure: Coronary Stent Intervention;  Surgeon: Yates Decamp, MD;  Location: [facility] INVASIVE CV LAB;  Service: Cardiovascular;  Laterality: N/A;  Prox LAD   CHOLECYSTECTOMY     CORONARY ANGIOPLASTY     KNEE ARTHROSCOPY     SHOULDER ARTHROSCOPY WITH ROTATOR CUFF REPAIR Right 09/22/2020   Procedure: Right Shoulder scope rotator cuff repair ,subacromial decompression and distal clavicle resection;  Surgeon: Yolonda Kida, MD;  Location: [facility];  Service: Orthopedics;  Laterality: Right;     There were no vitals filed for this  visit.   Subjective Assessment - 11/17/20 1023     Subjective COVID-19 screen performed prior to patient entering clinic. Patient had no complaints regarding shoulder.    Pertinent History MI, CAD, "silent" migraines.    Patient Stated Goals Use right UE without pain.    Currently in Pain? No/denies                [facility] PT Assessment - 11/17/20 0001       Assessment   Medical Diagnosis Right shoulder RCR, SAD, DCR    Referring Provider (PT) Duwayne Heck MD    Onset Date/Surgical Date 09/22/20                           [facility] Adult PT Treatment/Exercise - 11/17/20 0001       Shoulder Exercises: Supine   Protraction AROM;Right;20 reps    Flexion AROM;Right;20 reps;10 reps      Shoulder Exercises: Standing   Internal Rotation AAROM;Both;20 reps    Internal Rotation Limitations behind the back    Extension AAROM;Both;20 reps    Extension Limitations behind the back    Other Standing Exercises wall ladder x8 reps      Shoulder Exercises: Pulleys   Flexion 5 minutes      Shoulder Exercises: ROM/Strengthening   Ranger  UE ranger in standing flex, CW and CCW circles      Modalities   Modalities Vasopneumatic      Vasopneumatic   Number Minutes Vasopneumatic  10 minutes    Vasopnuematic Location  Shoulder    Vasopneumatic Pressure Low    Vasopneumatic Temperature  34      Manual Therapy   Manual Therapy Passive ROM    Passive ROM PROM of R shoulder into flex, ER, IR                          PT Long Term Goals - 10/19/20 1619       PT LONG TERM GOAL #1   Title Independent with a HEP.    Time 4    Period Weeks    Status New      PT LONG TERM GOAL #2   Title Active right shoulder flexion to 145 degrees so the patient can easily reach overhead.    Time 4    Period Weeks    Status New      PT LONG TERM GOAL #3   Title Active ER to 70 degrees+ to allow for easily donning/doffing of apparel.    Time 4    Period Weeks     Status New      PT LONG TERM GOAL #4   Title Perform ADL's with pain not > 3/10.    Time 4    Period Weeks    Status New      PT LONG TERM GOAL #5   Title Increase right shoulder strength to a solid 4+/5 to increase stability for performance of functional activities.    Time 4    Period Weeks    Status New                   Plan - 11/17/20 1230     Clinical Impression Statement Patient able to tolerate progression per protocol for AROM and AAROM. No complaints of pain were reported throughout therex session. Behind the back activities permitted at this time via protocol and patient able to tolerate well. No compensatory strategies or hesitancy noted with supine AROM flexion or protraction. Patient did report intermittantly outside of PT he has had some discomfort of intercostals and mid R bicep region. Normal vasopneumatic response noted following removal of the modality.    Personal Factors and Comorbidities Comorbidity 1;Comorbidity 2;Other    Comorbidities MI, CAD, "silent" migraines.    Examination-Activity Limitations Other;Reach Overhead    Examination-Participation Restrictions Other    Stability/Clinical Decision Making Stable/Uncomplicated    Rehab Potential Excellent    PT Frequency 2x / week    PT Duration 4 weeks    PT Treatment/Interventions ADLs/Self Care Home Management;Cryotherapy;Electrical Stimulation;Ultrasound;Moist Heat;Functional mobility training;Therapeutic activities;Therapeutic exercise;Neuromuscular re-education;Manual techniques;Patient/family education;Passive range of motion;Vasopneumatic Device    PT Next Visit Plan begin with PROM to patient's right shoulder.  Pendulum, supine passive cane stretch to increase ER and passive countertop stretch to improve flexion for HEP. See Protocol    Consulted and Agree with Plan of Care Patient             Patient will benefit from skilled therapeutic intervention in order to improve the following  deficits and impairments:  Decreased activity tolerance, Pain, Decreased range of motion, Increased edema  Visit Diagnosis: Chronic right shoulder pain  Stiffness of right shoulder, not elsewhere classified  Localized edema     Problem  List Patient Active Problem List   Diagnosis Date Noted   Syncope and collapse 01/29/2020   Diarrhea 01/29/2020   Dehydration 01/29/2020   Hypoalbuminemia 01/29/2020   Hyperglycemia 01/29/2020   Leukocytosis 01/29/2020   Hypercholesteremia 05/01/2018   Essential hypertension 05/01/2018   Myalgia due to statin 05/01/2018   History of MI (myocardial infarction) 05/01/2018   MI (myocardial infarction) [facility])    CAD (coronary artery disease)    ED (erectile dysfunction)    CAD (coronary artery disease), native coronary artery 10/02/2015    Marvell Fuller, PTA 11/17/2020, 12:35 PM  [facility] [address] [place], [place], [address] Phone: [phone]   Fax:  [phone]  Name: Daniel Walter MRN: [id number] Date of Birth: Jul 27, 1955

## 2020-11-24 ENCOUNTER — Other Ambulatory Visit: Payer: Self-pay

## 2020-11-24 ENCOUNTER — Ambulatory Visit: Payer: 59 | Admitting: Physical Therapy

## 2020-11-24 DIAGNOSIS — M25611 Stiffness of right shoulder, not elsewhere classified: Secondary | ICD-10-CM

## 2020-11-24 DIAGNOSIS — M25511 Pain in right shoulder: Secondary | ICD-10-CM | POA: Diagnosis not present

## 2020-11-24 DIAGNOSIS — G8929 Other chronic pain: Secondary | ICD-10-CM

## 2020-11-24 DIAGNOSIS — R600 Localized edema: Secondary | ICD-10-CM

## 2020-11-24 NOTE — Therapy (Addendum)
[facility] [place], [place], [address] Phone: [phone]   Fax:  [phone]  Physical Therapy Treatment  Patient Details  Name: Daniel Walter MRN: [id number] Date of Birth: 1955/10/29 Referring Provider (PT): Victorino December MD   Encounter Date: 11/24/2020   PT End of Session - 11/24/20 1042     Visit Number 8    Number of Visits 8    Date for PT Re-Evaluation 11/17/20    Authorization Type FOTO AT LEAST EVERY 5TH VISIT.  PROGRESS NOTE AT 10TH VISIT.  KX MODIFIER AFTER 15 VISITS.    PT Start Time [phone]    PT Stop Time 1034    PT Time Calculation (min) 43 min    Activity Tolerance Patient tolerated treatment well    Behavior During Therapy WFL for tasks assessed/performed             Past Medical History:  Diagnosis Date   Anxiety    CAD (coronary artery disease)    Coronary artery disease    ED (erectile dysfunction)    GERD (gastroesophageal reflux disease)    Hypertension    Hypothyroidism    MI (myocardial infarction) ([place])    Myalgia due to statin    Pre-diabetes     Past Surgical History:  Procedure Laterality Date   APPENDECTOMY     CARDIAC CATHETERIZATION N/A 10/02/2015   Procedure: Left Heart Cath and Coronary Angiography;  Surgeon: Adrian Prows, MD;  Location: [place] CV LAB;  Service: Cardiovascular;  Laterality: N/A;   CARDIAC CATHETERIZATION N/A 10/02/2015   Procedure: Coronary Stent Intervention;  Surgeon: Adrian Prows, MD;  Location: [place] CV LAB;  Service: Cardiovascular;  Laterality: N/A;  Prox LAD   CHOLECYSTECTOMY     CORONARY ANGIOPLASTY     KNEE ARTHROSCOPY     SHOULDER ARTHROSCOPY WITH ROTATOR CUFF REPAIR Right 09/22/2020   Procedure: Right Shoulder scope rotator cuff repair ,subacromial decompression and distal clavicle resection;  Surgeon: Nicholes Stairs, MD;  Location: [facility];  Service: Orthopedics;  Laterality: Right;  147mn    There were no vitals filed for this  visit.   Subjective Assessment - 11/24/20 1211     Subjective COVID-19 screen performed prior to patient entering clinic. No complaints. Doing everything at home that is needed.    Pertinent History MI, CAD, "silent" migraines.    Patient Stated Goals Use right UE without pain.    Currently in Pain? No/denies                [facility]       Assessment   Medical Diagnosis Right shoulder RCR, SAD, DCR    Referring Provider (PT) JVictorino DecemberMD    Onset Date/Surgical Date 09/22/20    Hand Dominance Right      Observation/Other Assessments   Focus on Therapeutic Outcomes (FOTO)  8% limitation 8th visit 11/24/2020      ROM / Strength   AROM / PROM / Strength AROM;Strength      AROM   Overall AROM  Within functional limits for tasks performed    AROM Assessment Site Shoulder    Right/Left Shoulder Right    Right Shoulder Flexion 151 Degrees    Right Shoulder Internal Rotation 74 Degrees    Right Shoulder External Rotation 55 Degrees   can reach behind his head     Strength   Overall Strength Within functional limits for tasks performed  Strength Assessment Site Shoulder    Right/Left Shoulder Right    Right Shoulder Flexion 4+/5    Right Shoulder ABduction 4+/5    Right Shoulder Internal Rotation 4+/5    Right Shoulder External Rotation 4+/5                           [facility] Adult PT Treatment/Exercise - 11/24/20 0001       Shoulder Exercises: Supine   Protraction AROM;Right;20 reps;10 reps    Diagonals AROM;Right;20 reps;10 reps      Shoulder Exercises: Sidelying   External Rotation AROM;Right;20 reps;10 reps    Flexion AROM;Right;20 reps;10 reps      Shoulder Exercises: Standing   Flexion AROM;Right;20 reps;10 reps    Other Standing Exercises AROM R shoulder scaption x30 reps      Shoulder Exercises: Pulleys   Flexion 5 minutes      Shoulder Exercises: ROM/Strengthening   UBE (Upper Arm Bike) 90 RPM x8 min     Wall Wash CW/CCW circles x fatigue                          PT Long Term Goals - 11/24/20 1032       PT LONG TERM GOAL #1   Title Independent with a HEP.    Time 4    Period Weeks    Status Achieved      PT LONG TERM GOAL #2   Title Active right shoulder flexion to 145 degrees so the patient can easily reach overhead.    Time 4    Period Weeks    Status Achieved      PT LONG TERM GOAL #3   Title Active ER to 70 degrees+ to allow for easily donning/doffing of apparel.    Time 4    Period Weeks    Status Not Met      PT LONG TERM GOAL #4   Title Perform ADL's with pain not > 3/10.    Time 4    Period Weeks    Status Achieved      PT LONG TERM GOAL #5   Title Increase right shoulder strength to a solid 4+/5 to increase stability for performance of functional activities.    Time 4    Period Weeks    Status Achieved                   Plan - 11/24/20 1154     Clinical Impression Statement Patient able to tolerate all of PT well since surgery. Patient able to complete all ADLs without concern or difficulty. No complaints of pain during today's therex session. No abnormal scapular winging noted. Limitations with AROM ER in supine but functionally able to don clothes and touch the back of his head without difficulty. Met all goals except AROM ER. Opted for discharge with 8% limitation on FOTO.    Personal Factors and Comorbidities Comorbidity 1;Comorbidity 2;Other    Comorbidities MI, CAD, "silent" migraines.    Examination-Activity Limitations Other;Reach Overhead    Examination-Participation Restrictions Other    Stability/Clinical Decision Making Stable/Uncomplicated    Rehab Potential Excellent    PT Frequency 2x / week    PT Duration 4 weeks    PT Treatment/Interventions ADLs/Self Care Home Management;Cryotherapy;Electrical Stimulation;Ultrasound;Moist Heat;Functional mobility training;Therapeutic activities;Therapeutic exercise;Neuromuscular  re-education;Manual techniques;Patient/family education;Passive range of motion;Vasopneumatic Device    PT Next Visit Plan D/C  Consulted and Agree with Plan of Care Patient             Patient will benefit from skilled therapeutic intervention in order to improve the following deficits and impairments:  Decreased activity tolerance, Pain, Decreased range of motion, Increased edema  Visit Diagnosis: Chronic right shoulder pain  Stiffness of right shoulder, not elsewhere classified  Localized edema     Problem List Patient Active Problem List   Diagnosis Date Noted   Syncope and collapse 01/29/2020   Diarrhea 01/29/2020   Dehydration 01/29/2020   Hypoalbuminemia 01/29/2020   Hyperglycemia 01/29/2020   Leukocytosis 01/29/2020   Hypercholesteremia 05/01/2018   Essential hypertension 05/01/2018   Myalgia due to statin 05/01/2018   History of MI (myocardial infarction) 05/01/2018   MI (myocardial infarction) [facility])    CAD (coronary artery disease)    ED (erectile dysfunction)    CAD (coronary artery disease), native coronary artery 10/02/2015   Standley Brooking, PTA 11/24/20 12:12 PM   [facility] [address] [place], [place], [address] Phone: [phone]   Fax:  [phone]  Name: Daniel Walter MRN: [id number] Date of Birth: 09-Jan-1956  PHYSICAL THERAPY DISCHARGE SUMMARY  Visits from Start of Care: 8.  Current functional level related to goals / functional outcomes: See above.   Remaining deficits: All goals met.   Education / Equipment: HEP.   Patient agrees to discharge. Patient goals were met. Patient is being discharged due to meeting the stated rehab goals.    Mali Applegate MPT

## 2021-02-12 ENCOUNTER — Other Ambulatory Visit: Payer: Self-pay | Admitting: Student

## 2021-03-09 ENCOUNTER — Encounter: Payer: Self-pay | Admitting: Student

## 2021-03-09 ENCOUNTER — Other Ambulatory Visit: Payer: Self-pay

## 2021-03-09 ENCOUNTER — Ambulatory Visit: Payer: 59 | Admitting: Student

## 2021-03-09 VITALS — BP 146/97 | HR 89 | Temp 98.4°F | Resp 16 | Ht 70.0 in | Wt 195.0 lb

## 2021-03-09 DIAGNOSIS — I2510 Atherosclerotic heart disease of native coronary artery without angina pectoris: Secondary | ICD-10-CM

## 2021-03-09 DIAGNOSIS — E7800 Pure hypercholesterolemia, unspecified: Secondary | ICD-10-CM

## 2021-03-09 DIAGNOSIS — I10 Essential (primary) hypertension: Secondary | ICD-10-CM

## 2021-03-09 NOTE — Progress Notes (Signed)
Primary Physician:  Marda Stalker, PA-C   Patient ID: Daniel Walter, male    DOB: 04-22-1955, 66 y.o.   MRN: [id number]  Subjective:    Chief Complaint  Patient presents with   Coronary Artery Disease   Hypertension   Hyperlipidemia   Follow-up    6 months     HPI: Daniel Walter  is a 66 y.o. male  with past medical history of CAD S/P  stenting with a Xience Alpine 3.5 x 23 mm DES. In 2013, diet controlled diabetes, hypertension, hyperlipidemia, GERD, and erectile dysfunction, severe allergy to milk and mild products. He was evaluated by allergist, but was not found to have dairy allergy but potential intolerance. Patient has eliminated dairy, gluten, yeast from his diet and has noticed improvement of sinus pressure and anxiety symptoms. Patient has a history of syncopal episode 01/29/2020, likely related to orthostatic hypotension.   He has had myalgia symptoms to several statins in the past and multiple medication intolerances.  Ideally patient is a good candidate for PCSK9 inhibitor, however he has been resistant and cost has been an issue.  Patient presents for 84-month follow-up of CAD, hypertension, hyperlipidemia.  Last office visit patient was stable from a cardiovascular standpoint, therefore no changes were made.  However he was advised to obtain repeat lipid profile testing, which is unfortunately not currently available for review.  Patient reports he continues to have episodes of dizziness and lightheadedness at home, noting low blood pressures.  He brings with him a written log of home blood pressure readings which shows episodes of hypotension.  Patient states he is currently taking telmisartan 40 mg every other day.  Denies chest pain, palpitations, dyspnea, syncope, near syncope.  He notes that he continues to struggle with episodes of significant anxiety, including today coming to our office.  Past Medical History:  Diagnosis Date   Anxiety    CAD (coronary artery  disease)    Coronary artery disease    ED (erectile dysfunction)    GERD (gastroesophageal reflux disease)    Hypertension    Hypothyroidism    MI (myocardial infarction) ([place])    Myalgia due to statin    Pre-diabetes    Past Surgical History:  Procedure Laterality Date   APPENDECTOMY     CARDIAC CATHETERIZATION N/A 10/02/2015   Procedure: Left Heart Cath and Coronary Angiography;  Surgeon: Adrian Prows, MD;  Location: [facility];  Service: Cardiovascular;  Laterality: N/A;   CARDIAC CATHETERIZATION N/A 10/02/2015   Procedure: Coronary Stent Intervention;  Surgeon: Adrian Prows, MD;  Location: [facility];  Service: Cardiovascular;  Laterality: N/A;  Prox LAD   CHOLECYSTECTOMY     CORONARY ANGIOPLASTY     KNEE ARTHROSCOPY     SHOULDER ARTHROSCOPY WITH ROTATOR CUFF REPAIR Right 09/22/2020   Procedure: Right Shoulder scope rotator cuff repair ,subacromial decompression and distal clavicle resection;  Surgeon: Nicholes Stairs, MD;  Location: [facility];  Service: Orthopedics;  Laterality: Right;  132min   Family History  Problem Relation Age of Onset   Heart attack Mother    Aneurysm Father    Social History   Tobacco Use   Smoking status: Never   Smokeless tobacco: Never  Substance Use Topics   Alcohol use: Not Currently    Comment: occ   Marital Status: Married   ROS:   Review of Systems  Cardiovascular:  Negative for chest pain, claudication, dyspnea on exertion, leg swelling, near-syncope, orthopnea, palpitations,  paroxysmal nocturnal dyspnea and syncope.  Respiratory:  Negative for shortness of breath.   Musculoskeletal:  Positive for arthritis (right shoulder).  Neurological:  Negative for dizziness.  Psychiatric/Behavioral:  The patient is nervous/anxious (improving).   Objective:  Blood pressure (!) 146/97, pulse 89, temperature 98.4 F (36.9 C), temperature source Temporal, resp. rate 16, height 5\' 10"  (1.778 m), weight 195 lb (88.5 kg), SpO2 98 %. Body  mass index is 27.98 kg/m.  Vitals with BMI 03/09/2021 03/09/2021 09/22/2020  Height - 5\' 10"  -  Weight - 195 lbs -  BMI - [id number] -  Systolic [id number]  Diastolic 97 98 90  Pulse 89 94 86      Physical Exam Vitals reviewed.  Constitutional:      General: He is not in acute distress.    Appearance: He is well-developed.  Cardiovascular:     Rate and Rhythm: Normal rate and regular rhythm.     Pulses: Intact distal pulses.     Heart sounds: Normal heart sounds, S1 normal and S2 normal. No murmur heard.   No gallop.     Comments: No JVD. No pedal edema Pulmonary:     Effort: Pulmonary effort is normal. No accessory muscle usage.     Breath sounds: Normal breath sounds.  Musculoskeletal:     Right lower leg: No edema.     Left lower leg: No edema.  Neurological:     Mental Status: He is alert.   Laboratory examination:    CMP Latest Ref Rng & Units 09/13/2020 03/02/2020 01/29/2020  Glucose 70 - 99 mg/dL 167(H) 190(H) 163(H)  BUN 8 - 23 mg/dL 14 7(L) 19  Creatinine 0.61 - 1.24 mg/dL 1.01 0.88 0.69  Sodium 135 - 145 mmol/L 137 142 133(L)  Potassium 3.5 - 5.1 mmol/L 4.5 4.8 3.8  Chloride 98 - 111 mmol/L 103 103 105  CO2 22 - 32 mmol/L 28 27 20(L)  Calcium 8.9 - 10.3 mg/dL 9.2 10.0 8.1(L)  Total Protein 6.5 - 8.1 g/dL - - 5.8(L)  Total Bilirubin 0.3 - 1.2 mg/dL - - 0.8  Alkaline Phos 38 - 126 U/L - - 54  AST 15 - 41 U/L - - 17  ALT 0 - 44 U/L - - 16   CBC Latest Ref Rng & Units 09/13/2020 01/29/2020 01/28/2020  WBC 4.0 - 10.5 K/uL 8.3 9.3 13.4(H)  Hemoglobin 13.0 - 17.0 g/dL 14.0 14.4 14.7  Hematocrit 39.0 - 52.0 % 42.9 41.4 42.4  Platelets 150 - 400 K/uL 344 218 228   Lipid Panel     Component Value Date/Time   CHOL 151 07/06/2019 1121   TRIG 171 (H) 07/06/2019 1121   HDL 41 07/06/2019 1121   CHOLHDL 3.4 10/02/2015 1834   VLDL 9 10/02/2015 1834   LDLCALC 81 07/06/2019 1121   HEMOGLOBIN A1C Lab Results  Component Value Date   HGBA1C 5.8 (H) 09/13/2020   MPG  119.76 09/13/2020   TSH No results for input(s): TSH in the last 8760 hours.  External labs: 03/17/2020: Hemoglobin 19.8, hematocrit 58.0, MCV 94, platelets 214 Glucose 215, BUN nine, creatinine 0.86, GFR 93, sodium 141, potassium 4.4, BMP otherwise normal A1c 6.2% TSH 3.13, free T4 1.2  02/07/2020: BUN 9.0, creatinine 0.80  11/15/2019: HDL 41, LDL 82, total cholesterol 150, triglycerides 159 A1c 6.5%  01/05/2019: Total cholesterol 154, triglycerides 134, HDL 39, LDL 89.  Non-HDL cholesterol 115.  Allergies   Allergies  Allergen Reactions   Gluten Meal  Pt preference    Lactose     Other reaction(s): GI upset   Milk-Related Compounds Diarrhea and Other (See Comments)    NO DAIRY; LETHARGY (ALSO)   Msud Aid [Alitraq]     Unknown    Yeast-Related Products     Blood tests     Medications Prior to Visit:   Outpatient Medications Prior to Visit  Medication Sig Dispense Refill   Ascorbic Acid (VITAMIN C) 250 MG CHEW Take 500 mg by mouth in the morning and at bedtime.     aspirin 81 MG chewable tablet Chew 1 tablet (81 mg total) by mouth daily. 30 tablet 1   Barberry-Oreg Grape-Goldenseal (BERBERINE COMPLEX PO) Take 1 capsule by mouth in the morning and at bedtime.     carvedilol (COREG) 6.25 MG tablet TAKE 1 TABLET 2 TIMES A DAY WITH MEALS (Patient taking differently: Take 6.25 mg by mouth 2 (two) times daily with a meal.) 180 tablet 1   Cholecalciferol (VITAMIN D3) 5000 units CAPS Take 5,000-10,000 Units by mouth daily.     DHEA 25 MG CAPS Take 25 mg by mouth daily.     Digestive Enzymes (DIGESTIVE ENZYME PO) Take 350 mg by mouth daily. Enzyme aid digestive, Natures life     ibuprofen (ADVIL) 200 MG tablet Take 400 mg by mouth every 8 (eight) hours as needed for moderate pain.     OVER THE COUNTER MEDICATION Take 1 tablet by mouth every other day. Methylation Complete     OVER THE COUNTER MEDICATION Take 75 mg by mouth daily. Pregnenolone     sildenafil (REVATIO) 20 MG  tablet TAKE 1 TABLET BY MOUTH AS NEEDED 90 tablet 0   testosterone cypionate (DEPOTESTOSTERONE CYPIONATE) 200 MG/ML injection Inject 0.7 mLs into the muscle once a week.     thyroid (ARMOUR) 60 MG tablet Take 60 mg by mouth daily before breakfast.     Zinc 50 MG CAPS Take 50 mg by mouth daily.     telmisartan (MICARDIS) 80 MG tablet TAKE 1 TABLET DAILY (Patient taking differently: Take 80 mg by mouth daily.) 30 tablet 3   ondansetron (ZOFRAN ODT) 4 MG disintegrating tablet Take 1 tablet (4 mg total) by mouth every 8 (eight) hours as needed for nausea or vomiting. 20 tablet 0   oxyCODONE (ROXICODONE) 5 MG immediate release tablet Take 1 tablet (5 mg total) by mouth every 4 (four) hours as needed for severe pain. 22 tablet 0   Probiotic Product (PROBIOTIC ADVANCED PO) Take 1 capsule by mouth in the morning and at bedtime. Mega Sporebiotic     No facility-administered medications prior to visit.   Final Medications at End of Visit    Current Meds  Medication Sig   Ascorbic Acid (VITAMIN C) 250 MG CHEW Take 500 mg by mouth in the morning and at bedtime.   aspirin 81 MG chewable tablet Chew 1 tablet (81 mg total) by mouth daily.   Barberry-Oreg Grape-Goldenseal (BERBERINE COMPLEX PO) Take 1 capsule by mouth in the morning and at bedtime.   carvedilol (COREG) 6.25 MG tablet TAKE 1 TABLET 2 TIMES A DAY WITH MEALS (Patient taking differently: Take 6.25 mg by mouth 2 (two) times daily with a meal.)   Cholecalciferol (VITAMIN D3) 5000 units CAPS Take 5,000-10,000 Units by mouth daily.   DHEA 25 MG CAPS Take 25 mg by mouth daily.   Digestive Enzymes (DIGESTIVE ENZYME PO) Take 350 mg by mouth daily. Enzyme aid digestive, Natures life  ibuprofen (ADVIL) 200 MG tablet Take 400 mg by mouth every 8 (eight) hours as needed for moderate pain.   OVER THE COUNTER MEDICATION Take 1 tablet by mouth every other day. Methylation Complete   OVER THE COUNTER MEDICATION Take 75 mg by mouth daily. Pregnenolone    sildenafil (REVATIO) 20 MG tablet TAKE 1 TABLET BY MOUTH AS NEEDED   testosterone cypionate (DEPOTESTOSTERONE CYPIONATE) 200 MG/ML injection Inject 0.7 mLs into the muscle once a week.   thyroid (ARMOUR) 60 MG tablet Take 60 mg by mouth daily before breakfast.   Zinc 50 MG CAPS Take 50 mg by mouth daily.   [DISCONTINUED] telmisartan (MICARDIS) 80 MG tablet TAKE 1 TABLET DAILY (Patient taking differently: Take 80 mg by mouth daily.)   Radiology  No results found.   Cardiac Studies:   Bilateral Carotid Duplex 01/29/2020:  Minimal amount of bilateral intimal thickening/atherosclerotic plaque, left subjectively greater than right, not resulting in a hemodynamically significant stenosis within either internal carotid artery.  Echocardiogram 01/29/2020:   1. Left ventricular ejection fraction, by estimation, is 65 to 70%. The left ventricle has normal function. The left ventricle has no regional wall motion abnormalities. There is mild left ventricular hypertrophy. Left ventricular diastolic parameters are consistent with Grade I diastolic dysfunction (impaired relaxation).   2. Right ventricular systolic function is normal. The right ventricular size is normal. Tricuspid regurgitation signal is inadequate for assessing PA pressure.   3. The mitral valve is grossly normal. Trivial mitral valve regurgitation.   4. The aortic valve is tricuspid. Aortic valve regurgitation is not visualized.   5. The inferior vena cava is normal in size with greater than 50% respiratory variability, suggesting right atrial pressure of 3 mmHg.   6. Cannot exclude PFO.   Coronary angiogram 10/02/2015: 1. Ost LAD to Prox LAD lesion, 99 %stenosed. XIENCE ALPINE RX [id number] drug eluting stent. Post intervention, there is a 0% residual stenosis. TIMI flow improved from TIMI 1 to TIMI 3 at end of the procedure. Otherwise normal coronary arteries. 2. There is severe left ventricular systolic dysfunction. The left ventricular  ejection fraction is less than 25% by visual estimate.   EKG  09/07/2020: Sinus rhythm at a rate of 90 bpm.  Left atrial enlargement.  Normal axis.  Nonspecific T wave abnormality.  No significant change compared to EKG 02/23/2020.  06/21/2019: Normal sinus rhythm with rate of 73 bpm, normal axis.  Poor R wave progression, cannot exclude anteroseptal infarct old.  Normal QT interval.  No evidence of ischemia. Lov voltage.  No significant change from EKG 12/21/2018.  Assessment:     ICD-10-CM   1. Coronary artery disease involving native coronary artery of native heart without angina pectoris  I25.10     2. Hypercholesteremia  E78.00 Lipid Panel With LDL/HDL Ratio    3. Essential hypertension  I10       No orders of the defined types were placed in this encounter.   Medications Discontinued During This Encounter  Medication Reason   ondansetron (ZOFRAN ODT) 4 MG disintegrating tablet    Probiotic Product (PROBIOTIC ADVANCED PO)    oxyCODONE (ROXICODONE) 5 MG immediate release tablet    telmisartan (MICARDIS) 80 MG tablet    Recommendations:   Daniel Walter  is a 66 y.o. with past medical history of CAD S/P  stenting with a Xience Alpine 3.5 x 23 mm DES. In 2013, diet controlled diabetes, hypertension, hyperlipidemia, GERD, and erectile dysfunction, severe allergy to milk and mild  products. He was evaluated by allergist, but was not found to have dairy allergy but potential intolerance. Patient has eliminated dairy, gluten, yeast from his diet and has noticed improvement of sinus pressure and anxiety symptoms. Patient has a history of syncopal episode 01/29/2020, likely related to orthostatic hypotension.   Patient has been unable to tolerate red yeast rice due to GI issues. He has had myalgia symptoms to several statins in the past and multiple medication intolerances.  Ideally patient is a good candidate for PCSK9 inhibitor, however he has been resistant and cost has been an  issue.  Patient presents for 56-month follow-up of CAD, hypertension, hyperlipidemia.  Last office visit patient was stable from a cardiovascular standpoint, therefore no changes were made.  However he was advised to obtain repeat lipid profile testing, which is unfortunately not currently available for review.  Given soft blood pressures at home and episodes of dizziness and lightheadedness will stop telmisartan.  Patient will continue to monitor blood pressure at home and bring a written log to his next appointment.  Could consider restarting low-dose of telmisartan if able based on hemodynamics.  In regard to hyperlipidemia patient will have repeat lipid profile testing done.  He is not open to discussing PCSK9 bitters or Leqvio if lipids remain uncontrolled given history of CAD.  Patient's blood pressure is elevated at today's office visit likely due to anxiety.  He monitors it closely at home.  Follow-up in 3 months, sooner if needed, for CAD, hypertension, hyperlipidemia.   Alethia Berthold, PA-C 03/09/2021, 11:46 AM Office: [phone]

## 2021-03-13 ENCOUNTER — Other Ambulatory Visit: Payer: Self-pay | Admitting: Cardiology

## 2021-03-13 DIAGNOSIS — I10 Essential (primary) hypertension: Secondary | ICD-10-CM

## 2021-03-28 IMAGING — US US CAROTID DUPLEX BILAT
1 series · 13 of 24 positions shown · non-contrast
Comparison: None.

CLINICAL DATA: Syncope and collapse. History of CAD, post
myocardial infarction, and hypertension.

EXAM:
BILATERAL CAROTID DUPLEX ULTRASOUND
TECHNIQUE: Gray scale imaging, color Doppler and duplex ultrasound were
performed of bilateral carotid and vertebral arteries in the neck.

[Series 1: us carotid bilateral · 13 of 80 slices shown]
[im 1/80]
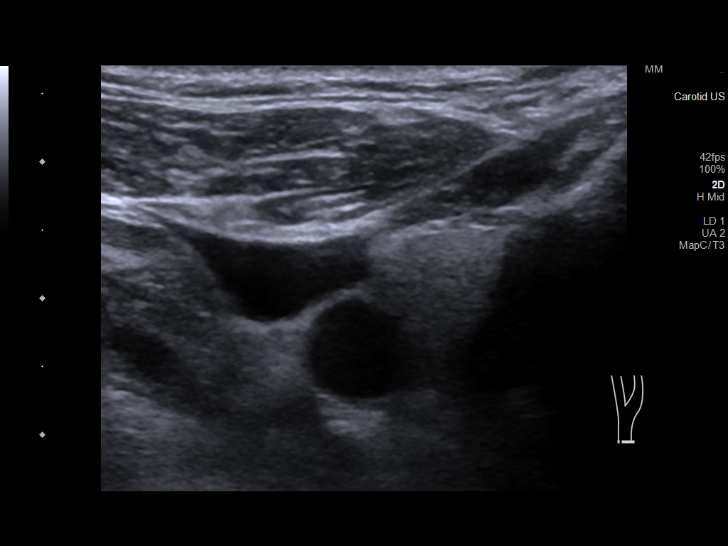
[im 7/80]
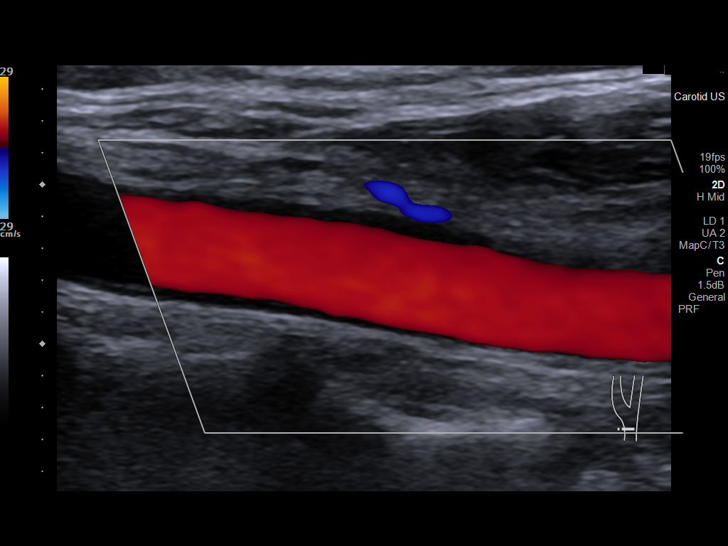
[im 14/80]
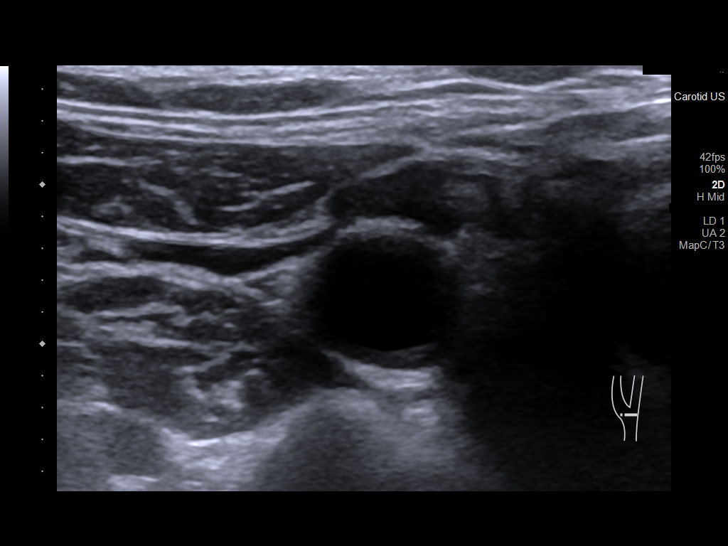
[im 21/80]
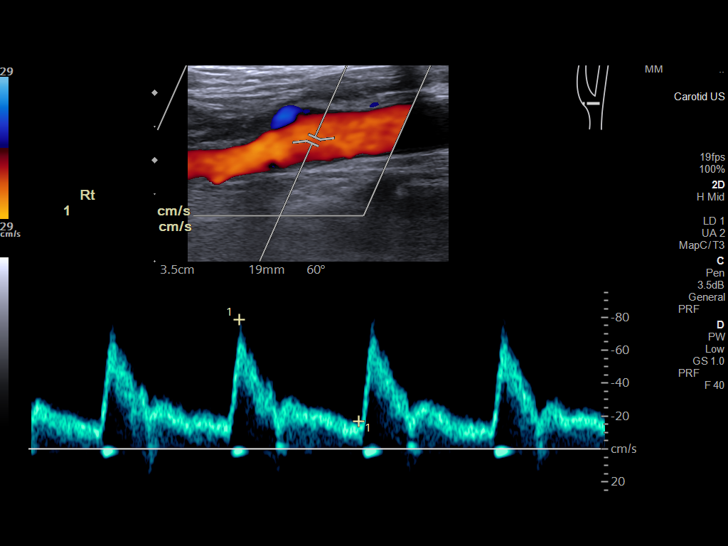
[im 28/80]
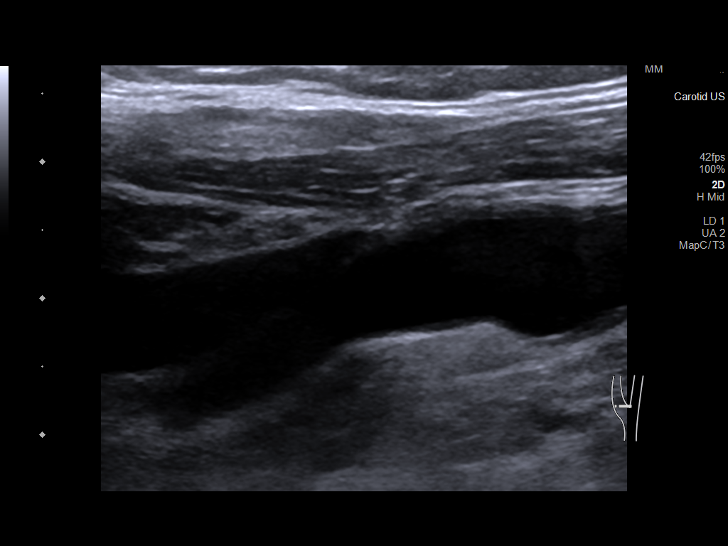
[im 35/80]
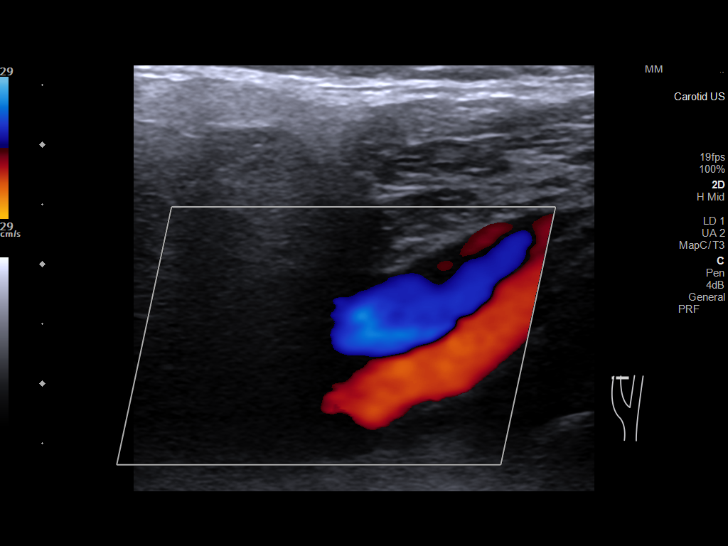
[im 42/80]
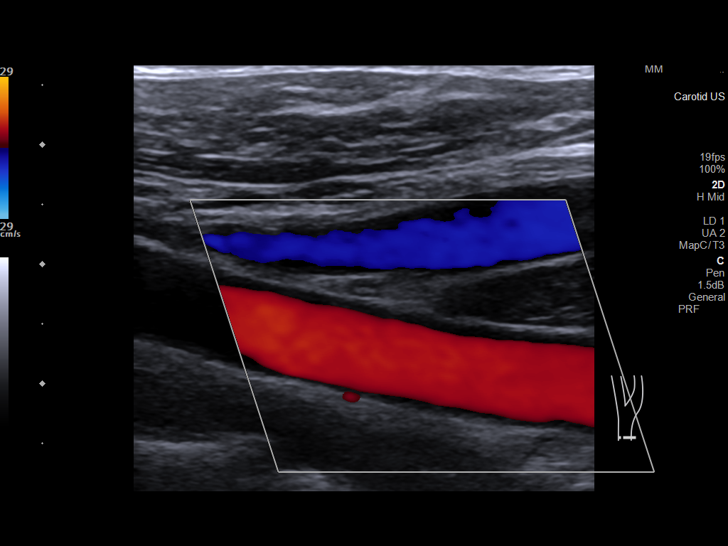
[im 45/80]
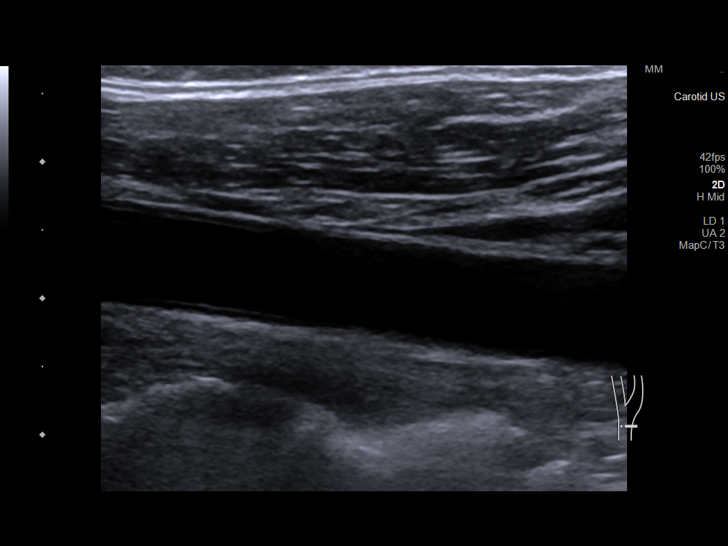
[im 52/80]
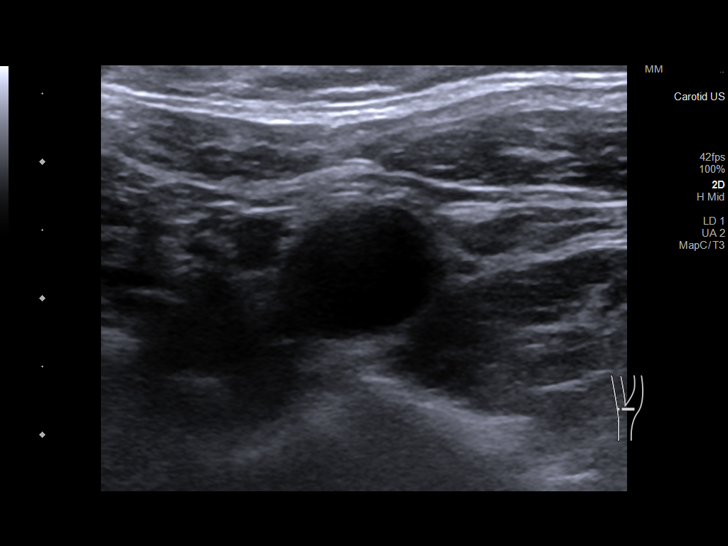
[im 59/80]
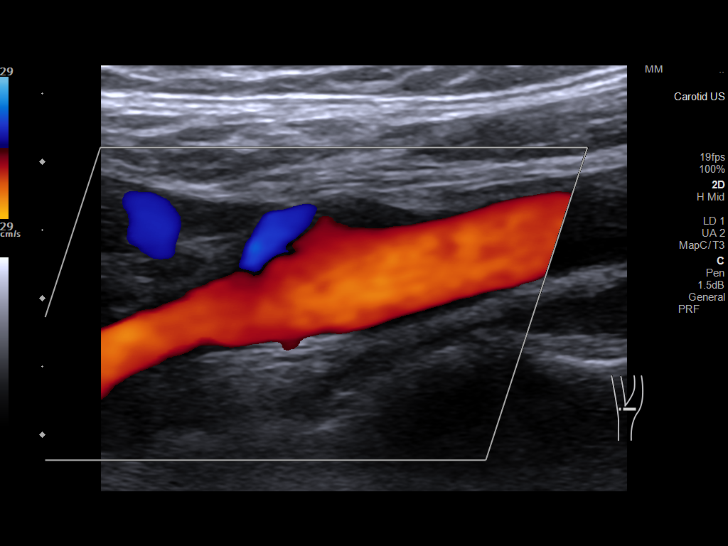
[im 66/80]
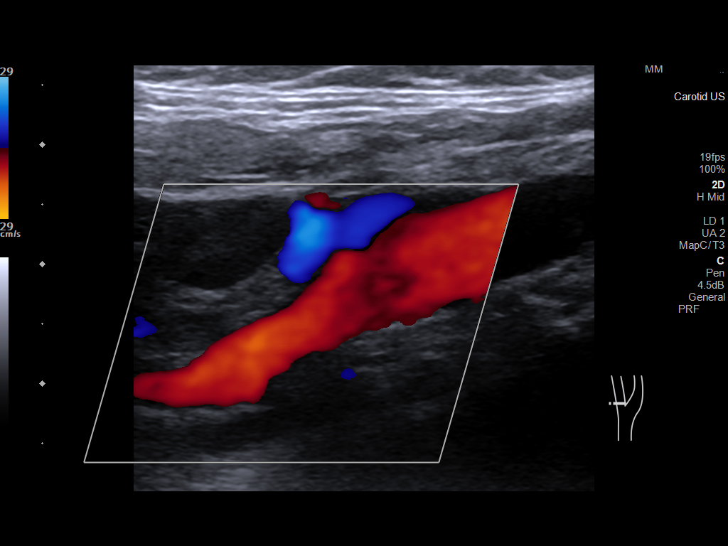
[im 73/80]
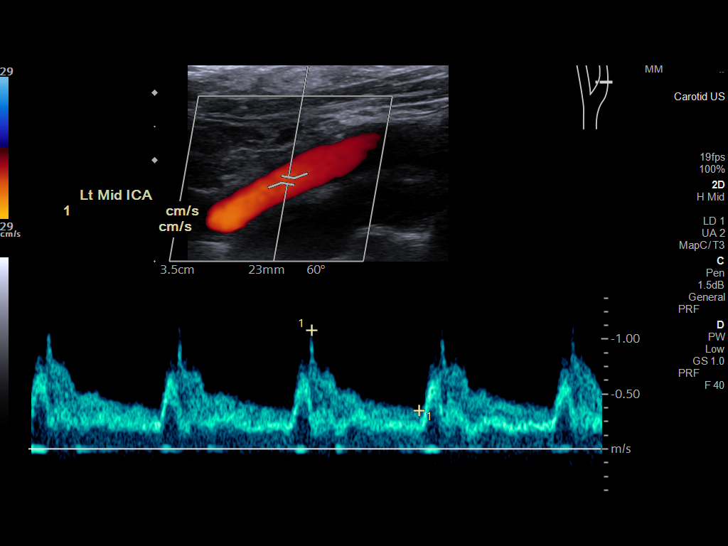
[im 80/80]
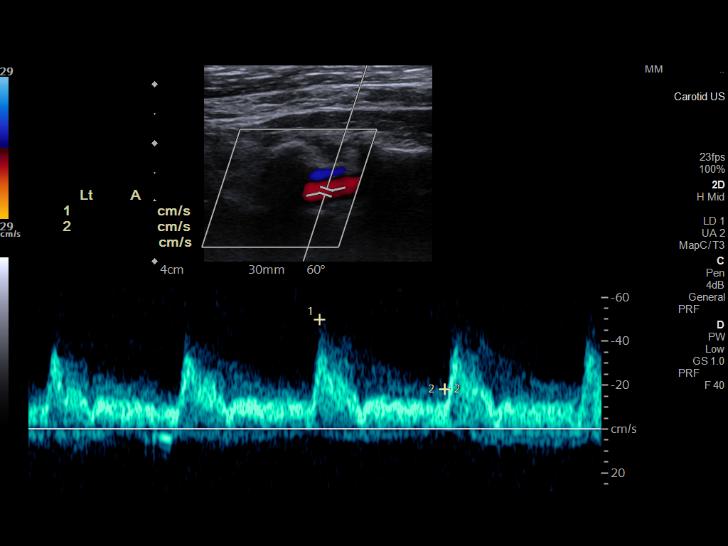

[13 of 24 positions shown; findings below may reference images not displayed]

FINDINGS: Criteria: Quantification of carotid stenosis is based on velocity
parameters that correlate the residual internal carotid diameter
with NASCET-based stenosis levels, using the diameter of the distal
internal carotid lumen as the denominator for stenosis measurement.

The following velocity measurements were obtained:

RIGHT

ICA: 115/36 cm/sec

CCA: 86/20 cm/sec

SYSTOLIC ICA/CCA RATIO:

ECA: 116 cm/sec

LEFT

ICA: 108/35 cm/sec

CCA: 88/22 cm/sec

SYSTOLIC ICA/CCA RATIO:

ECA: 113 cm/sec

RIGHT CAROTID ARTERY: There is a minimal amount of intimal
thickening involving the right carotid bulb (image 14 and 18), not
resulting in elevated peak systolic velocities within the
interrogated course of the right internal carotid artery to suggest
a hemodynamically significant stenosis.

RIGHT VERTEBRAL ARTERY:  Antegrade Flow

LEFT CAROTID ARTERY: There is a minimal amount of eccentric
echogenic plaque involving the left carotid bulb (image 55), not
resulting in elevated peak systolic velocities within the
interrogated course of the left internal carotid artery to suggest a
hemodynamically significant stenosis.

LEFT VERTEBRAL ARTERY:  Antegrade flow
IMPRESSION: Minimal amount of bilateral intimal thickening/atherosclerotic
plaque, left subjectively greater than right, not resulting in a
hemodynamically significant stenosis within either internal carotid
artery.

## 2021-05-14 ENCOUNTER — Other Ambulatory Visit: Payer: Self-pay | Admitting: Student

## 2021-06-21 NOTE — Progress Notes (Signed)
? ?Primary Physician:  Jarrett Soho, PA-C ? ? ?Patient ID: Daniel Walter, male    DOB: 20-Jan-1956, 66 y.o.   MRN: [id number] ? ?Subjective:  ? ? ?Chief Complaint  ?Patient presents with  ? Coronary Artery Disease  ? Hypertension  ? hld  ?  3 month  ? ?HPI: Daniel Walter  is a 66 y.o. male  with past medical history of CAD S/P  stenting with a Xience Alpine 3.5 x 23 mm DES. In 2013, diet controlled diabetes, hypertension, hyperlipidemia, GERD, and erectile dysfunction, severe allergy to milk and mild products. He was evaluated by allergist, but was not found to have dairy allergy but potential intolerance. Patient has eliminated dairy, gluten, yeast from his diet and has noticed improvement of sinus pressure and anxiety symptoms. Patient has a history of syncopal episode 01/29/2020, likely related to orthostatic hypotension. ?  ?He has had myalgia symptoms to several statins in the past and multiple medication intolerances.  Ideally patient is a good candidate for PCSK9 inhibitor, however he has been resistant and cost has been an issue. ? ?Patient presents for 83-month follow-up.  Last office visit given episodes of symptomatic hypotension stopped telmisartan and ordered repeat lipid profile testing, which was done at [facility]. Patient states he is feeling well overall although he does continue to have intermittent headaches for which she continues to follow with [facility] for management.  Denies dyspnea, chest pain, dizziness, syncope, near syncope.  Patient states dizziness, lightheadedness, and fatigue have essentially resolved since discontinuing telmisartan. ? ?He notes that he continues to struggle with episodes of significant anxiety, including today coming to our office.  Although blood pressure is elevated in the office today he brings with him a extensive log of home blood pressure readings which are under excellent control. ? ?Past Medical History:  ?Diagnosis Date  ? Anxiety   ? CAD  (coronary artery disease)   ? Coronary artery disease   ? ED (erectile dysfunction)   ? GERD (gastroesophageal reflux disease)   ? Hypertension   ? Hypothyroidism   ? MI (myocardial infarction) (HCC)   ? Myalgia due to statin   ? Pre-diabetes   ? ?Past Surgical History:  ?Procedure Laterality Date  ? APPENDECTOMY    ? CARDIAC CATHETERIZATION N/A 10/02/2015  ? Procedure: Left Heart Cath and Coronary Angiography;  Surgeon: Yates Decamp, MD;  Location: [facility] INVASIVE CV LAB;  Service: Cardiovascular;  Laterality: N/A;  ? CARDIAC CATHETERIZATION N/A 10/02/2015  ? Procedure: Coronary Stent Intervention;  Surgeon: Yates Decamp, MD;  Location: [facility] INVASIVE CV LAB;  Service: Cardiovascular;  Laterality: N/A;  Prox LAD  ? CHOLECYSTECTOMY    ? CORONARY ANGIOPLASTY    ? KNEE ARTHROSCOPY    ? SHOULDER ARTHROSCOPY WITH ROTATOR CUFF REPAIR Right 09/22/2020  ? Procedure: Right Shoulder scope rotator cuff repair ,subacromial decompression and distal clavicle resection;  Surgeon: Yolonda Kida, MD;  Location: [facility];  Service: Orthopedics;  Laterality: Right;   ? ?Family History  ?Problem Relation Age of Onset  ? Heart attack Mother   ? Aneurysm Father   ? ?Social History  ? ?Tobacco Use  ? Smoking status: Never  ? Smokeless tobacco: Never  ?Substance Use Topics  ? Alcohol use: Not Currently  ?  Comment: occ  ? Marital Status: Married  ? ?ROS:  ? ?Review of Systems  ?Cardiovascular:  Negative for chest pain, claudication, dyspnea on exertion, leg swelling, near-syncope, orthopnea, palpitations, paroxysmal nocturnal dyspnea and  syncope.  ?Respiratory:  Negative for shortness of breath.   ?Musculoskeletal:  Positive for arthritis (right shoulder).  ?Neurological:  Negative for dizziness.  ?Psychiatric/Behavioral:  The patient is nervous/anxious (improving).   ? ?Objective:  ?Blood pressure (!) 162/93, pulse 91, temperature 98.3 ?F (36.8 ?C), temperature source Temporal, resp. rate 17, height 5\' 10"  (1.778 m), weight 187 lb 12.8 oz  (85.2 kg), SpO2 100 %. Body mass index is 26.95 kg/m?.  ? ?  06/22/2021  ? 10:11 AM 06/22/2021  ? 10:04 AM 03/09/2021  ?  9:29 AM  ?Vitals with BMI  ?Height  5\' 10"    ?Weight  187 lbs 13 oz   ?BMI  26.95   ?Systolic 162 162 233  ?Diastolic 93 92 97  ?Pulse 91 80 89  ?    ?Physical Exam ?Vitals reviewed.  ?Constitutional:   ?   General: He is not in acute distress. ?   Appearance: He is well-developed.  ?Cardiovascular:  ?   Rate and Rhythm: Normal rate and regular rhythm.  ?   Pulses: Intact distal pulses.  ?   Heart sounds: Normal heart sounds, S1 normal and S2 normal. No murmur heard. ?  No gallop.  ?   Comments: No JVD. ?No pedal edema ?Pulmonary:  ?   Effort: Pulmonary effort is normal. No accessory muscle usage.  ?   Breath sounds: Normal breath sounds.  ?Musculoskeletal:  ?   Right lower leg: No edema.  ?   Left lower leg: No edema.  ?Neurological:  ?   Mental Status: He is alert.  ?Physical exam unchanged compared to previous office visit.  ? ?Laboratory examination:  ? ? ? ?  Latest Ref Rng & Units 09/13/2020  ?  9:42 AM 03/02/2020  ?  3:58 PM 01/29/2020  ?  5:16 AM  ?CMP  ?Glucose 70 - 99 mg/dL 007   622   633    ?BUN 8 - 23 mg/dL 14   7   19     ?Creatinine 0.61 - 1.24 mg/dL 3.54   5.62   5.63    ?Sodium 135 - 145 mmol/L 137   142   133    ?Potassium 3.5 - 5.1 mmol/L 4.5   4.8   3.8    ?Chloride 98 - 111 mmol/L 103   103   105    ?CO2 22 - 32 mmol/L 28   27   20     ?Calcium 8.9 - 10.3 mg/dL 9.2   89.3   8.1    ?Total Protein 6.5 - 8.1 g/dL   5.8    ?Total Bilirubin 0.3 - 1.2 mg/dL   0.8    ?Alkaline Phos 38 - 126 U/L   54    ?AST 15 - 41 U/L   17    ?ALT 0 - 44 U/L   16    ? ? ?  Latest Ref Rng & Units 09/13/2020  ?  9:42 AM 01/29/2020  ?  5:16 AM 01/28/2020  ? 11:18 PM  ?CBC  ?WBC 4.0 - 10.5 K/uL 8.3   9.3   13.4    ?Hemoglobin 13.0 - 17.0 g/dL 73.4   28.7   68.1    ?Hematocrit 39.0 - 52.0 % 42.9   41.4   42.4    ?Platelets 150 - 400 K/uL 344   218   228    ? ?Lipid Panel  ?   ?Component Value Date/Time  ? CHOL  151 07/06/2019 1121  ?  TRIG 171 (H) 07/06/2019 1121  ? HDL 41 07/06/2019 1121  ? CHOLHDL 3.4 10/02/2015 1834  ? VLDL 9 10/02/2015 1834  ? LDLCALC 81 07/06/2019 1121  ? ?HEMOGLOBIN A1C ?Lab Results  ?Component Value Date  ? HGBA1C 5.8 (H) 09/13/2020  ? MPG 119.76 09/13/2020  ? ?TSH ?No results for input(s): TSH in the last 8760 hours. ? ?External labs: ?05/08/2021: ?Hgb 15.3, HCT 49.5, MCV 85, platelet 376 ?BUN 16, creatinine 0.91, GFR >60, sodium 137, potassium 4.8 ?Total cholesterol 149, triglycerides 81, HDL 35, LDL 98 ?A1c 5.8% ? ?03/17/2020: ?Hemoglobin 19.8, hematocrit 58.0, MCV 94, platelets 214 ?Glucose 215, BUN nine, creatinine 0.86, GFR 93, sodium 141, potassium 4.4, BMP otherwise normal ?A1c 6.2% ?TSH 3.13, free T4 1.2 ? ?02/07/2020: ?BUN 9.0, creatinine 0.80 ? ?11/15/2019: ?HDL 41, LDL 82, total cholesterol 409, triglycerides 159 ?A1c 6.5% ? ?01/05/2019: ?Total cholesterol 154, triglycerides 134, HDL 39, LDL 89.  Non-HDL cholesterol 115. ? ?Allergies  ? ?Allergies  ?Allergen Reactions  ? Gluten Meal   ?  Pt preference   ? Lactose   ?  Other reaction(s): GI upset  ? Milk-Related Compounds Diarrhea and Other (See Comments)  ?  NO DAIRY; LETHARGY (ALSO)  ? Msud Aid [Alitraq]   ?  Unknown   ? Yeast-Related Products   ?  Blood tests   ?  ?Medications Prior to Visit:  ? ?Outpatient Medications Prior to Visit  ?Medication Sig Dispense Refill  ? Ascorbic Acid (VITAMIN C) 250 MG CHEW Take 500 mg by mouth in the morning and at bedtime.    ? aspirin 81 MG chewable tablet Chew 1 tablet (81 mg total) by mouth daily. 30 tablet 1  ? Barberry-Oreg Grape-Goldenseal (BERBERINE COMPLEX PO) Take 1 capsule by mouth in the morning and at bedtime.    ? carvedilol (COREG) 6.25 MG tablet TAKE 1 TABLET 2 TIMES A DAY WITH MEALS 180 tablet 1  ? Cholecalciferol (VITAMIN D3) 5000 units CAPS Take 5,000-10,000 Units by mouth daily.    ? DHEA 25 MG CAPS Take 25 mg by mouth daily.    ? Digestive Enzymes (DIGESTIVE ENZYME PO) Take 350 mg by  mouth daily. Enzyme aid digestive, Natures life    ? ibuprofen (ADVIL) 200 MG tablet Take 400 mg by mouth every 8 (eight) hours as needed for moderate pain.    ? OVER THE COUNTER MEDICATION Take 1 tablet by mout

## 2021-06-22 ENCOUNTER — Encounter: Payer: Self-pay | Admitting: Student

## 2021-06-22 ENCOUNTER — Ambulatory Visit: Payer: 59 | Admitting: Student

## 2021-06-22 VITALS — BP 162/93 | HR 91 | Temp 98.3°F | Resp 17 | Ht 70.0 in | Wt 187.8 lb

## 2021-06-22 DIAGNOSIS — I2510 Atherosclerotic heart disease of native coronary artery without angina pectoris: Secondary | ICD-10-CM

## 2021-06-22 DIAGNOSIS — E7800 Pure hypercholesterolemia, unspecified: Secondary | ICD-10-CM

## 2021-06-22 DIAGNOSIS — I10 Essential (primary) hypertension: Secondary | ICD-10-CM

## 2021-09-14 ENCOUNTER — Other Ambulatory Visit: Payer: Self-pay | Admitting: Student

## 2021-09-14 DIAGNOSIS — I10 Essential (primary) hypertension: Secondary | ICD-10-CM

## 2021-10-07 DIAGNOSIS — N3001 Acute cystitis with hematuria: Secondary | ICD-10-CM | POA: Diagnosis not present

## 2021-11-02 DIAGNOSIS — R319 Hematuria, unspecified: Secondary | ICD-10-CM | POA: Diagnosis not present

## 2021-11-02 DIAGNOSIS — I10 Essential (primary) hypertension: Secondary | ICD-10-CM | POA: Diagnosis not present

## 2021-11-02 DIAGNOSIS — E1169 Type 2 diabetes mellitus with other specified complication: Secondary | ICD-10-CM | POA: Diagnosis not present

## 2021-11-02 DIAGNOSIS — I2510 Atherosclerotic heart disease of native coronary artery without angina pectoris: Secondary | ICD-10-CM | POA: Diagnosis not present

## 2021-12-21 ENCOUNTER — Ambulatory Visit: Payer: Self-pay | Admitting: Student

## 2022-01-18 DIAGNOSIS — F909 Attention-deficit hyperactivity disorder, unspecified type: Secondary | ICD-10-CM | POA: Diagnosis not present

## 2022-01-18 DIAGNOSIS — E559 Vitamin D deficiency, unspecified: Secondary | ICD-10-CM | POA: Diagnosis not present

## 2022-01-18 DIAGNOSIS — F419 Anxiety disorder, unspecified: Secondary | ICD-10-CM | POA: Diagnosis not present

## 2022-01-18 DIAGNOSIS — E291 Testicular hypofunction: Secondary | ICD-10-CM | POA: Diagnosis not present

## 2022-01-18 DIAGNOSIS — E039 Hypothyroidism, unspecified: Secondary | ICD-10-CM | POA: Diagnosis not present

## 2022-01-18 DIAGNOSIS — R5383 Other fatigue: Secondary | ICD-10-CM | POA: Diagnosis not present

## 2022-02-01 DIAGNOSIS — R5383 Other fatigue: Secondary | ICD-10-CM | POA: Diagnosis not present

## 2022-02-01 DIAGNOSIS — M2550 Pain in unspecified joint: Secondary | ICD-10-CM | POA: Diagnosis not present

## 2022-02-01 DIAGNOSIS — E039 Hypothyroidism, unspecified: Secondary | ICD-10-CM | POA: Diagnosis not present

## 2022-02-01 DIAGNOSIS — E291 Testicular hypofunction: Secondary | ICD-10-CM | POA: Diagnosis not present

## 2022-04-12 DIAGNOSIS — M2550 Pain in unspecified joint: Secondary | ICD-10-CM | POA: Diagnosis not present

## 2022-04-12 DIAGNOSIS — R197 Diarrhea, unspecified: Secondary | ICD-10-CM | POA: Diagnosis not present

## 2022-04-12 DIAGNOSIS — E559 Vitamin D deficiency, unspecified: Secondary | ICD-10-CM | POA: Diagnosis not present

## 2022-04-12 DIAGNOSIS — E291 Testicular hypofunction: Secondary | ICD-10-CM | POA: Diagnosis not present

## 2022-04-12 DIAGNOSIS — F419 Anxiety disorder, unspecified: Secondary | ICD-10-CM | POA: Diagnosis not present

## 2022-04-12 DIAGNOSIS — R7309 Other abnormal glucose: Secondary | ICD-10-CM | POA: Diagnosis not present

## 2022-04-12 DIAGNOSIS — E039 Hypothyroidism, unspecified: Secondary | ICD-10-CM | POA: Diagnosis not present

## 2022-05-24 DIAGNOSIS — E291 Testicular hypofunction: Secondary | ICD-10-CM | POA: Diagnosis not present

## 2022-05-24 DIAGNOSIS — R5383 Other fatigue: Secondary | ICD-10-CM | POA: Diagnosis not present

## 2022-05-24 DIAGNOSIS — E039 Hypothyroidism, unspecified: Secondary | ICD-10-CM | POA: Diagnosis not present

## 2022-05-24 DIAGNOSIS — M2550 Pain in unspecified joint: Secondary | ICD-10-CM | POA: Diagnosis not present
